# Patient Record
Sex: Male | Born: 1950 | Race: White | Hispanic: No | Marital: Married | Smoking: Never smoker
Health system: Southern US, Community
[De-identification: ages and names within clinical notes are randomized; demographics above are authoritative.]

## PROBLEM LIST (undated history)

## (undated) ENCOUNTER — Emergency Department: Admission: EM | Payer: Medicare PPO | Source: Home / Self Care

## (undated) DIAGNOSIS — I10 Essential (primary) hypertension: Secondary | ICD-10-CM

---

## 2017-07-11 ENCOUNTER — Encounter (INDEPENDENT_AMBULATORY_CARE_PROVIDER_SITE_OTHER): Payer: Self-pay | Admitting: *Deleted

## 2018-10-02 ENCOUNTER — Encounter (INDEPENDENT_AMBULATORY_CARE_PROVIDER_SITE_OTHER): Payer: Self-pay | Admitting: *Deleted

## 2019-03-19 ENCOUNTER — Encounter: Payer: Self-pay | Admitting: Internal Medicine

## 2019-04-22 ENCOUNTER — Ambulatory Visit: Payer: Medicare Other

## 2019-06-30 ENCOUNTER — Other Ambulatory Visit: Payer: Self-pay

## 2019-06-30 DIAGNOSIS — Z20822 Contact with and (suspected) exposure to covid-19: Secondary | ICD-10-CM | POA: Diagnosis present

## 2019-06-30 DIAGNOSIS — K219 Gastro-esophageal reflux disease without esophagitis: Secondary | ICD-10-CM | POA: Diagnosis present

## 2019-06-30 DIAGNOSIS — R61 Generalized hyperhidrosis: Secondary | ICD-10-CM | POA: Diagnosis not present

## 2019-06-30 DIAGNOSIS — R110 Nausea: Secondary | ICD-10-CM | POA: Diagnosis not present

## 2019-06-30 DIAGNOSIS — E876 Hypokalemia: Secondary | ICD-10-CM | POA: Diagnosis present

## 2019-06-30 DIAGNOSIS — K56600 Partial intestinal obstruction, unspecified as to cause: Principal | ICD-10-CM | POA: Diagnosis present

## 2019-06-30 DIAGNOSIS — E871 Hypo-osmolality and hyponatremia: Secondary | ICD-10-CM | POA: Diagnosis present

## 2019-06-30 DIAGNOSIS — R0602 Shortness of breath: Secondary | ICD-10-CM | POA: Diagnosis not present

## 2019-06-30 DIAGNOSIS — R55 Syncope and collapse: Secondary | ICD-10-CM | POA: Diagnosis not present

## 2019-06-30 DIAGNOSIS — I10 Essential (primary) hypertension: Secondary | ICD-10-CM | POA: Diagnosis present

## 2019-06-30 DIAGNOSIS — E869 Volume depletion, unspecified: Secondary | ICD-10-CM | POA: Diagnosis present

## 2019-07-01 ENCOUNTER — Encounter (HOSPITAL_COMMUNITY): Payer: Self-pay | Admitting: Emergency Medicine

## 2019-07-01 ENCOUNTER — Inpatient Hospital Stay (HOSPITAL_COMMUNITY): Payer: Medicare PPO

## 2019-07-01 ENCOUNTER — Emergency Department (HOSPITAL_COMMUNITY): Payer: Medicare PPO

## 2019-07-01 ENCOUNTER — Other Ambulatory Visit: Payer: Self-pay

## 2019-07-01 ENCOUNTER — Inpatient Hospital Stay (HOSPITAL_COMMUNITY)
Admission: EM | Admit: 2019-07-01 | Discharge: 2019-07-02 | DRG: 389 | Disposition: A | Discharge status: Home / Self Care | Payer: Medicare PPO | Attending: Internal Medicine | Admitting: Internal Medicine

## 2019-07-01 DIAGNOSIS — K567 Ileus, unspecified: Secondary | ICD-10-CM | POA: Diagnosis not present

## 2019-07-01 DIAGNOSIS — I10 Essential (primary) hypertension: Secondary | ICD-10-CM | POA: Diagnosis present

## 2019-07-01 DIAGNOSIS — R55 Syncope and collapse: Secondary | ICD-10-CM | POA: Diagnosis not present

## 2019-07-01 DIAGNOSIS — K56600 Partial intestinal obstruction, unspecified as to cause: Secondary | ICD-10-CM | POA: Diagnosis present

## 2019-07-01 DIAGNOSIS — Z0189 Encounter for other specified special examinations: Secondary | ICD-10-CM

## 2019-07-01 DIAGNOSIS — R0600 Dyspnea, unspecified: Secondary | ICD-10-CM

## 2019-07-01 DIAGNOSIS — E876 Hypokalemia: Secondary | ICD-10-CM

## 2019-07-01 DIAGNOSIS — R110 Nausea: Secondary | ICD-10-CM | POA: Diagnosis not present

## 2019-07-01 DIAGNOSIS — R0602 Shortness of breath: Secondary | ICD-10-CM | POA: Diagnosis not present

## 2019-07-01 DIAGNOSIS — E869 Volume depletion, unspecified: Secondary | ICD-10-CM | POA: Diagnosis present

## 2019-07-01 DIAGNOSIS — R61 Generalized hyperhidrosis: Secondary | ICD-10-CM | POA: Diagnosis not present

## 2019-07-01 DIAGNOSIS — E871 Hypo-osmolality and hyponatremia: Secondary | ICD-10-CM

## 2019-07-01 DIAGNOSIS — Z20822 Contact with and (suspected) exposure to covid-19: Secondary | ICD-10-CM | POA: Diagnosis present

## 2019-07-01 DIAGNOSIS — K56609 Unspecified intestinal obstruction, unspecified as to partial versus complete obstruction: Secondary | ICD-10-CM

## 2019-07-01 DIAGNOSIS — K219 Gastro-esophageal reflux disease without esophagitis: Secondary | ICD-10-CM | POA: Diagnosis present

## 2019-07-01 HISTORY — DX: Essential (primary) hypertension: I10

## 2019-07-01 LAB — COMPREHENSIVE METABOLIC PANEL
ALT: 39 U/L (ref 0–44)
AST: 25 U/L (ref 15–41)
Albumin: 4.3 g/dL (ref 3.5–5.0)
Alkaline Phosphatase: 38 U/L (ref 38–126)
Anion gap: 10 (ref 5–15)
BUN: 37 mg/dL — ABNORMAL HIGH (ref 8–23)
CO2: 24 mmol/L (ref 22–32)
Calcium: 8 mg/dL — ABNORMAL LOW (ref 8.9–10.3)
Chloride: 94 mmol/L — ABNORMAL LOW (ref 98–111)
Creatinine, Ser: 1.48 mg/dL — ABNORMAL HIGH (ref 0.61–1.24)
GFR calc Af Amer: 56 mL/min — ABNORMAL LOW (ref >60)
GFR calc non Af Amer: 48 mL/min — ABNORMAL LOW (ref >60)
Glucose, Bld: 117 mg/dL — ABNORMAL HIGH (ref 70–99)
Potassium: 3.3 mmol/L — ABNORMAL LOW (ref 3.5–5.1)
Sodium: 128 mmol/L — ABNORMAL LOW (ref 135–145)
Total Bilirubin: 1 mg/dL (ref 0.3–1.2)
Total Protein: 7.8 g/dL (ref 6.5–8.1)

## 2019-07-01 LAB — CBC
HCT: 45.6 % (ref 39.0–52.0)
HCT: 46.1 % (ref 39.0–52.0)
Hemoglobin: 15.4 g/dL (ref 13.0–17.0)
Hemoglobin: 15.6 g/dL (ref 13.0–17.0)
MCH: 29.6 pg (ref 26.0–34.0)
MCH: 29.5 pg (ref 26.0–34.0)
MCHC: 33.8 g/dL (ref 30.0–36.0)
MCHC: 33.8 g/dL (ref 30.0–36.0)
MCV: 87.5 fL (ref 80.0–100.0)
MCV: 87.1 fL (ref 80.0–100.0)
Platelets: 264 10*3/uL (ref 150–400)
Platelets: 290 10*3/uL (ref 150–400)
RBC: 5.21 MIL/uL (ref 4.22–5.81)
RBC: 5.29 MIL/uL (ref 4.22–5.81)
RDW: 14.3 % (ref 11.5–15.5)
RDW: 14.3 % (ref 11.5–15.5)
WBC: 8.4 10*3/uL (ref 4.0–10.5)
WBC: 6.5 10*3/uL (ref 4.0–10.5)
nRBC: 0 % (ref 0.0–0.2)
nRBC: 0 % (ref 0.0–0.2)

## 2019-07-01 LAB — BASIC METABOLIC PANEL
Anion gap: 12 (ref 5–15)
BUN: 32 mg/dL — ABNORMAL HIGH (ref 8–23)
CO2: 25 mmol/L (ref 22–32)
Calcium: 8.3 mg/dL — ABNORMAL LOW (ref 8.9–10.3)
Chloride: 96 mmol/L — ABNORMAL LOW (ref 98–111)
Creatinine, Ser: 1.2 mg/dL (ref 0.61–1.24)
GFR calc Af Amer: >60 mL/min (ref >60)
GFR calc non Af Amer: >60 mL/min (ref >60)
Glucose, Bld: 119 mg/dL — ABNORMAL HIGH (ref 70–99)
Potassium: 3.8 mmol/L (ref 3.5–5.1)
Sodium: 133 mmol/L — ABNORMAL LOW (ref 135–145)

## 2019-07-01 LAB — URINALYSIS, ROUTINE W REFLEX MICROSCOPIC
Bacteria, UA: NONE SEEN
Bilirubin Urine: NEGATIVE
Glucose, UA: NEGATIVE mg/dL
Ketones, ur: 5 mg/dL — AB
Leukocytes,Ua: NEGATIVE
Nitrite: NEGATIVE
Protein, ur: NEGATIVE mg/dL
Specific Gravity, Urine: 1.024 (ref 1.005–1.030)
pH: 5 (ref 5.0–8.0)

## 2019-07-01 LAB — TROPONIN I (HIGH SENSITIVITY)
Troponin I (High Sensitivity): 5 ng/L (ref <18)
Troponin I (High Sensitivity): 5 ng/L (ref <18)

## 2019-07-01 LAB — SARS CORONAVIRUS 2 (TAT 6-24 HRS): SARS Coronavirus 2: NEGATIVE

## 2019-07-01 LAB — D-DIMER, QUANTITATIVE: D-Dimer, Quant: 1.62 ug/mL-FEU — ABNORMAL HIGH (ref 0.00–0.50)

## 2019-07-01 LAB — LIPASE, BLOOD: Lipase: 21 U/L (ref 11–51)

## 2019-07-01 LAB — GLUCOSE, CAPILLARY: Glucose-Capillary: 104 mg/dL — ABNORMAL HIGH (ref 70–99)

## 2019-07-01 LAB — HIV ANTIBODY (ROUTINE TESTING W REFLEX): HIV Screen 4th Generation wRfx: NONREACTIVE

## 2019-07-01 LAB — LACTIC ACID, PLASMA: Lactic Acid, Venous: 0.9 mmol/L (ref 0.5–1.9)

## 2019-07-01 MED ORDER — ONDANSETRON HCL 4 MG PO TABS: 4.0000 mg | ORAL_TABLET | Freq: Four times a day (QID) | ORAL | Status: DC | PRN

## 2019-07-01 MED ORDER — ACETAMINOPHEN 650 MG RE SUPP: 650.0000 mg | Freq: Four times a day (QID) | RECTAL | Status: DC | PRN

## 2019-07-01 MED ORDER — DIATRIZOATE MEGLUMINE & SODIUM 66-10 % PO SOLN
ORAL | Status: AC
  Filled 2019-07-01: qty 90

## 2019-07-01 MED ORDER — DIATRIZOATE MEGLUMINE & SODIUM 66-10 % PO SOLN
90.0000 mL | Freq: Once | ORAL | Status: DC
  Filled 2019-07-01: qty 90

## 2019-07-01 MED ORDER — FENTANYL CITRATE (PF) 100 MCG/2ML IJ SOLN
50.0000 ug | Freq: Once | INTRAMUSCULAR | Status: AC
  Administered 2019-07-01: 01:00:00 50 ug via INTRAVENOUS
  Filled 2019-07-01: qty 2

## 2019-07-01 MED ORDER — IOHEXOL 300 MG/ML  SOLN
100.0000 mL | Freq: Once | INTRAMUSCULAR | Status: AC | PRN
  Administered 2019-07-01: 100 mL via INTRAVENOUS

## 2019-07-01 MED ORDER — SODIUM CHLORIDE 0.9% FLUSH: 3.0000 mL | Freq: Once | INTRAVENOUS | Status: DC

## 2019-07-01 MED ORDER — POTASSIUM CHLORIDE IN NACL 20-0.9 MEQ/L-% IV SOLN: INTRAVENOUS | Status: DC

## 2019-07-01 MED ORDER — FENTANYL CITRATE (PF) 100 MCG/2ML IJ SOLN
50.0000 ug | Freq: Once | INTRAMUSCULAR | Status: AC
  Administered 2019-07-01: 08:00:00 50 ug via INTRAVENOUS
  Filled 2019-07-01: qty 2

## 2019-07-01 MED ORDER — DIATRIZOATE MEGLUMINE & SODIUM 66-10 % PO SOLN
90.0000 mL | Freq: Once | ORAL | Status: AC
  Administered 2019-07-01: 13:00:00 90 mL via NASOGASTRIC
  Filled 2019-07-01: qty 90

## 2019-07-01 MED ORDER — POTASSIUM CHLORIDE 10 MEQ/100ML IV SOLN
10.0000 meq | INTRAVENOUS | Status: AC
  Administered 2019-07-01 (×2): 10 meq via INTRAVENOUS
  Filled 2019-07-01: qty 100

## 2019-07-01 MED ORDER — FENTANYL CITRATE (PF) 100 MCG/2ML IJ SOLN
50.0000 ug | INTRAMUSCULAR | Status: DC | PRN
  Administered 2019-07-01: 13:00:00 50 ug via INTRAVENOUS
  Filled 2019-07-01: qty 2

## 2019-07-01 MED ORDER — PANTOPRAZOLE SODIUM 40 MG IV SOLR
40.0000 mg | INTRAVENOUS | Status: DC
  Administered 2019-07-01 – 2019-07-02 (×2): 40 mg via INTRAVENOUS
  Filled 2019-07-01 (×2): qty 40

## 2019-07-01 MED ORDER — ACETAMINOPHEN 325 MG PO TABS: 650.0000 mg | ORAL_TABLET | Freq: Four times a day (QID) | ORAL | Status: DC | PRN

## 2019-07-01 MED ORDER — ENOXAPARIN SODIUM 40 MG/0.4ML ~~LOC~~ SOLN
40.0000 mg | SUBCUTANEOUS | Status: DC
  Administered 2019-07-01: 40 mg via SUBCUTANEOUS
  Filled 2019-07-01: qty 0.4

## 2019-07-01 MED ORDER — ONDANSETRON HCL 4 MG/2ML IJ SOLN: 4.0000 mg | Freq: Four times a day (QID) | INTRAMUSCULAR | Status: DC | PRN

## 2019-07-01 MED ORDER — HYDRALAZINE HCL 20 MG/ML IJ SOLN: 10.0000 mg | Freq: Four times a day (QID) | INTRAMUSCULAR | Status: DC | PRN

## 2019-07-01 MED ORDER — SODIUM CHLORIDE 0.9 % IV BOLUS
1000.0000 mL | Freq: Once | INTRAVENOUS | Status: AC
  Administered 2019-07-01: 01:00:00 1000 mL via INTRAVENOUS

## 2019-07-01 MED ORDER — ONDANSETRON HCL 4 MG/2ML IJ SOLN
4.0000 mg | Freq: Once | INTRAMUSCULAR | Status: AC
  Administered 2019-07-01: 4 mg via INTRAVENOUS
  Filled 2019-07-01: qty 2

## 2019-07-01 NOTE — H&P (Signed)
History and Physical  George Horton [id number] DOB: [birth year] DOA: 07/01/2019   PCP: Beola Cord, FNP   Patient coming from: Home  Chief Complaint: Abdominal pain and vomiting  HPI:  George Horton is a 69 y.o. male with medical history of hypertension, GERD presenting with 2-day history of abdominal pain with nausea and vomiting.  The patient states that he began having abdominal pain on 06/28/2019 after eating some microwave lasagna.  He subsequently had an episode of nausea and vomiting.  In the early morning on 06/29/2019, the the patient had another episode of nausea and vomiting.  He stated that he had some temporary relief of his abdominal pain and vomiting that day.  However, on 06/30/2019, the patient ate some potato soup and began having worsening abdominal pain again with another episode of emesis.  He tried taking some over-the-counter laxatives and suppositories without any success.  His last bowel movement was on 06/28/2019.  He denies any fevers, chills, chest pain, shortness breath, cough.hemoptysis, diarrhea, hematochezia, melena, headache, neck pain.  Patient takes 2 Aleve daily for arthritis pain for the last 5 years. In the emergency department, patient was afebrile hemodynamically stable with oxygen saturation 95% room air.  Sodium 128, potassium 3.3, serum creatinine 1.48.  LFTs were unremarkable.  WBC 8.4, hemoglobin 15.4, platelets 264,000.  CT abdomen/pelvis showed multiple dilated loops of small bowel throughout the abdomen consistent with a partial small bowel obstruction.  UA was negative for pyuria.  EKG shows sinus rhythm with nonspecific ST-T wave changes.  Assessment/Plan: Small bowel obstruction -General surgery consult -Remain n.p.o. -IV fluids -Judicious IV opioids -Increase activity  Essential hypertension -Holding lisinopril until able to take p.o. -hold HCTZ due to volume depletion -Hydralazine prn SBP >180  Hyponatremia -Secondary to volume  depletion -Start IV fluids  Hypokalemia -Replete -Check magnesium  GERD -Continue Protonix  Active Problems:   Partial small bowel obstruction (HCC)       Past Medical History:  Diagnosis Date  . Hypertension    History reviewed. No pertinent surgical history. Social History:  reports that he has never smoked. He has never used smokeless tobacco. He reports that he does not drink alcohol or use drugs.   Family Hx: reviewed, no pertinent family hx  Prior to Admission medications   Not on File    Review of Systems:  Constitutional:  No weight loss, night sweats, Fevers, chills, fatigue.  Head&Eyes: No headache.  No vision loss.  No eye pain or scotoma ENT:  No Difficulty swallowing,Tooth/dental problems,Sore throat,  No ear ache, post nasal drip,  Cardio-vascular:  No chest pain, Orthopnea, PND, swelling in lower extremities,  dizziness, palpitations  GI:  No  diarrhea, loss of appetite, hematochezia, melena, heartburn, indigestion, Resp:  No shortness of breath with exertion or at rest. No cough. No coughing up of blood .No wheezing.No chest wall deformity  Skin:  no rash or lesions.  GU:  no dysuria, change in color of urine, no urgency or frequency. No flank pain.  Musculoskeletal:  No joint pain or swelling. No decreased range of motion. No back pain.  Psych:  No change in mood or affect. No depression or anxiety. Neurologic: No headache, no dysesthesia, no focal weakness, no vision loss. No syncope  Physical Exam: Vitals:   07/01/19 0130 07/01/19 0700 07/01/19 0800 07/01/19 0830  BP: 110/78 127/75 138/89 (!) 131/106  Pulse: (!) 101 100 100 100  Resp: 18 15 17  (!) 22  Temp:  TempSrc:      SpO2: 92% 100% 100% 100%  Weight:      Height:       General:  A&O x 3, NAD, nontoxic, pleasant/cooperative Head/Eye: No conjunctival hemorrhage, no icterus, [place]/AT, No nystagmus ENT:  No icterus,  No thrush, good dentition, no pharyngeal exudate Neck:  No  masses, no lymphadenpathy, no bruits CV:  RRR, no rub, no gallop, no S3 Lung:  CTAB, good air movement, no wheeze, no rhonchi Abdomen: soft/peribumbilical, +BS, nondistended, no peritoneal signs Ext: No cyanosis, No rashes, No petechiae, No lymphangitis, No edema Neuro: CNII-XII intact, strength 4/5 in bilateral upper and lower extremities, no dysmetria  Labs on Admission:  Basic Metabolic Panel: Recent Labs  Lab 07/01/19 0111  NA 128*  K 3.3*  CL 94*  CO2 24  GLUCOSE 117*  BUN 37*  CREATININE 1.48*  CALCIUM 8.0*   Liver Function Tests: Recent Labs  Lab 07/01/19 0111  AST 25  ALT 39  ALKPHOS 38  BILITOT 1.0  PROT 7.8  ALBUMIN 4.3   Recent Labs  Lab 07/01/19 0111  LIPASE 21   No results for input(s): AMMONIA in the last 168 hours. CBC: Recent Labs  Lab 07/01/19 0111  WBC 8.4  HGB 15.4  HCT 45.6  MCV 87.5  PLT 264   Coagulation Profile: No results for input(s): INR, PROTIME in the last 168 hours. Cardiac Enzymes: No results for input(s): CKTOTAL, CKMB, CKMBINDEX, TROPONINI in the last 168 hours. BNP: Invalid input(s): POCBNP CBG: No results for input(s): GLUCAP in the last 168 hours. Urine analysis:    Component Value Date/Time   COLORURINE YELLOW 07/01/2019 0025   APPEARANCEUR CLEAR 07/01/2019 0025   LABSPEC 1.024 07/01/2019 0025   PHURINE 5.0 07/01/2019 0025   GLUCOSEU NEGATIVE 07/01/2019 0025   HGBUR SMALL (A) 07/01/2019 0025   BILIRUBINUR NEGATIVE 07/01/2019 0025   KETONESUR 5 (A) 07/01/2019 0025   PROTEINUR NEGATIVE 07/01/2019 0025   NITRITE NEGATIVE 07/01/2019 0025   LEUKOCYTESUR NEGATIVE 07/01/2019 0025   Sepsis Labs: @LABRCNTIP (procalcitonin:4,lacticidven:4) )No results found for this or any previous visit (from the past 240 hour(s)).   Radiological Exams on Admission: CT ABDOMEN PELVIS W CONTRAST  Result Date: 07/01/2019 CLINICAL DATA:  Right upper quadrant pain. EXAM: CT ABDOMEN AND PELVIS WITH CONTRAST TECHNIQUE: Multidetector CT  imaging of the abdomen and pelvis was performed using the standard protocol following bolus administration of intravenous contrast. CONTRAST:  151mL OMNIPAQUE IOHEXOL 300 MG/ML  SOLN COMPARISON:  None. FINDINGS: Lower chest: Mild linear scarring and/or atelectasis is seen along the anteromedial aspect of the right middle lobe. Hepatobiliary: No focal liver abnormality is seen. No gallstones, gallbladder wall thickening, or biliary dilatation. Pancreas: Unremarkable. No pancreatic ductal dilatation or surrounding inflammatory changes. Spleen: Normal in size without focal abnormality. Adrenals/Urinary Tract: Adrenal glands are unremarkable. Kidneys are normal, without renal calculi, focal lesion, or hydronephrosis. Bladder is unremarkable. Stomach/Bowel: Stomach is within normal limits. Appendix appears normal. Multiple dilated loops of small bowel are seen throughout the abdomen and pelvis. A clear transition zone is not identified. Vascular/Lymphatic: Mild to moderate severity aortic calcification. No enlarged abdominal or pelvic lymph nodes. Reproductive: The prostate gland is mildly enlarged. Other: There is a 4.9 cm x 3.9 cm fat containing right inguinal hernia. An additional 4.4 cm x 2.8 cm fat containing left inguinal hernia is seen. Musculoskeletal: Multilevel degenerative changes seen throughout the lumbar spine. IMPRESSION: 1. Multiple dilated loops of small bowel throughout the abdomen and pelvis, consistent with  a partial small bowel obstruction. 2. Bilateral fat containing inguinal hernias. 3. Mild to moderate severity aortic calcification. 4. Mildly enlarged prostate gland. Aortic Atherosclerosis (ICD10-I70.0). Electronically Signed   By: Virgina Norfolk M.D.   On: 07/01/2019 03:09    EKG: Independently reviewed. Sinus, nonspecific STT changes III, aVF    Time spent:60 minutes Code Status:   FULL Family Communication:  No Family at bedside Disposition Plan: expect 2-3 day  hospitalization Consults called: general surgery DVT Prophylaxis: [place] Lovenox  Orson Eva, DO  [facility] Pager [phone]  If 7PM-7AM, please contact night-coverage [email or web address] Password [facility] 07/01/2019, 8:44 AM

## 2019-07-01 NOTE — ED Notes (Signed)
Dr. Carles Collet in room assessing patient.

## 2019-07-01 NOTE — Progress Notes (Signed)
[facility]  Saw note for Rapid response. Patient with vagal reaction. If gets very nauseated or vomits would get NG placed. No vomiting since gastrografin taken.  Dilated loops on Xray otherwise.   Discussed with Dr. Carles Collet.  Curlene Labrum, MD [facility] [address] [place], [place] [address] [phone] (office)

## 2019-07-01 NOTE — Progress Notes (Signed)
Responded to nursing call:  Rapid response approx 5-10 min after drinking gastrograffin, pt developed nausea, diaphoresis, dizziness, near syncope.  Denies f/c, cp, cough. Denies focal extremity weakness, visual disturbance.  Complains of left bicep pain.   98.4--HR--83--107/90-->120.80 97%RA   Vitals:   07/01/19 0800 07/01/19 0830 07/01/19 0916 07/01/19 1307  BP: 138/89 (!) 131/106 126/80 107/90  Pulse: 100 100 96 87  Resp: 17 (!) 22 20 20   Temp:   98 F (36.7 C) 98.4 F (36.9 C)  TempSrc:   Oral Oral  SpO2: 100% 100% 97% 96%  Weight:      Height:       CV--RRR Lung--CTA Abd--soft+BS/peribumbilical tender, ND  Personally reviewed EKC--sinus, nonspecific TWI inferior leads CXR-   Assessment/Plan:  Vagal reaction -CBG 104 -cycle troponins -D-dimer -BMP CBC -place on tele    Orson Eva, DO [facility]

## 2019-07-01 NOTE — ED Notes (Signed)
Have paged Dr. Joesph Fillers about patient.

## 2019-07-01 NOTE — Progress Notes (Signed)
Patient received Gastrogafin 90 mL at 1239. At 1306 patient called to say he was not feeling well. Patient was observed to be very diaphoretic and pale with decreased level of consciousness. Rapid response was called and MD paged. MD is currently in room assessing patient. Respiratory therapist and rapid response nurse also in room. EKG stat obtained and vital signs are as follows     07/01/19 1307  Vitals  Temp 98.4 F (36.9 C)  Temp Source Oral  BP 107/90  BP Location Right Arm  BP Method Automatic  Patient Position (if appropriate) Lying  Pulse Rate 87  Pulse Rate Source Dinamap  Resp 20  Oxygen Therapy  SpO2 96 %  O2 Device Room Air  MEWS Score  MEWS Temp 0  MEWS Systolic 0  MEWS Pulse 0  MEWS RR 0  MEWS LOC 0  MEWS Score 0  MEWS Score Color Green   CBG 104. Chest XRay stat also obtained and orders for labs including troponin at this time. Patient is now observed to have increased level of consciousness. Will continue to monitor patient.

## 2019-07-01 NOTE — Care Management Important Message (Signed)
Important Message  Patient Details  Name: George Horton MRN: [id number] Date of Birth: [birth year]   Medicare Important Message Given:  Yes     Tommy Medal 07/01/2019, 4:02 PM

## 2019-07-01 NOTE — Progress Notes (Signed)
   07/01/19 1307  Assess: MEWS Score  Temp 98.4 F (36.9 C)  BP 107/90  Pulse Rate 87  Resp 20  SpO2 96 %  O2 Device Room Air  Assess: MEWS Score  MEWS Temp 0  MEWS Systolic 0  MEWS Pulse 0  MEWS RR 0  MEWS LOC 0  MEWS Score 0  MEWS Score Color Green  Treat  MEWS Interventions Escalated (See documentation below);Consulted Respiratory Therapy  Take Vital Signs  Increase Vital Sign Frequency   (Q 2 hr x 2)  Escalate  MEWS: Escalate  (Mews score green however q 2 hr vitals x 2)  Notify: Charge Nurse/RN  Name of Charge Nurse/RN Notified L Jeffree Cazeau   Date Charge Nurse/RN Notified 07/01/19  Time Charge Nurse/RN Notified 1306  Notify: Provider  Provider Name/Title Dr. Carles Collet  Date Provider Notified 07/01/19  Time Provider Notified 1307  Notification Type Page  Notification Reason Change in status  Response See new orders (EKG 12 lead stat, chest XRay stat, Troponins and labs)  Date of Provider Response 07/01/19  Time of Provider Response [id number]  Notify: Rapid Response  Name of Rapid Response RN Notified Tiffany  Date Rapid Response Notified 07/01/19  Time Rapid Response Notified [id number]  Document  Patient Outcome Stabilized after interventions

## 2019-07-01 NOTE — Progress Notes (Signed)
[facility]  Dilated bowel and contrast not to colon but clinically improved and BMs.  Will repeat X-ray in am. Continue clears for now.  Curlene Labrum, MD

## 2019-07-01 NOTE — ED Triage Notes (Signed)
Pt presents with complaints of abdominal pain, cramping, and bloating. Pt states this began on Friday after eating a salad and throwing up.

## 2019-07-01 NOTE — Consult Note (Signed)
[facility] Consult  Reason for Consult: pSBO versus ileus  Referring Physician:  Dr. Carles Collet   Chief Complaint    Abdominal Pain      HPI: George Horton is a 69 y.o. male with complaints of acute onset of RLQ pain and nausea with some vomiting. He came to the hospital last night and reports not having a BM since Friday. He says he normally goes every few days and takes metamucil to have a BM. He says that he actually took a laxative this weekend, but still could not have a BM. He says he is having flatus and last vomited last night.  He says that his pain has been better since getting pain medication in the ED. He was evaluated with a Ct and labs. He as found to have hyponatremia and hypokalemia thought to be secondary to his vomiting.  He denies ever having any abdominal surgery or bad abdominal infections. He has had a colonoscopy at age 36 and this was negative. He reports having a virtual visit to discuss a repeat in the upcoming weeks.   He otherwise is healthy and has no cardiac issues or complaints. He denies any chest pain or SOB.   Past Medical History:  Diagnosis Date  . Hypertension     History reviewed. No pertinent surgical history.  Family History  Problem Relation Age of Onset  . Atrial fibrillation Mother   . Heart failure Father   . Colon cancer Neg Hx     Social History   Tobacco Use  . Smoking status: Never Smoker  . Smokeless tobacco: Never Used  Substance Use Topics  . Alcohol use: Never  . Drug use: Never    Medications:  I have reviewed the patient's current medications. Prior to Admission:  Medications Prior to Admission  Medication Sig Dispense Refill Last Dose  . hydrochlorothiazide (HYDRODIURIL) 12.5 MG tablet Take 12.5 mg by mouth every morning.   06/30/2019 at 0730  . lisinopril (ZESTRIL) 20 MG tablet Take 20 mg by mouth daily.   06/30/2019 at 0730  . naproxen sodium (ALEVE) 220 MG tablet Take 440 mg by mouth daily. Arthritis pain.    06/30/2019 at 0730  . omeprazole (PRILOSEC) 40 MG capsule Take 40 mg by mouth every morning.   06/30/2019 at 0730   Scheduled: . diatrizoate meglumine-sodium      . diatrizoate meglumine-sodium  90 mL Oral Once  . enoxaparin (LOVENOX) injection  40 mg Subcutaneous Q24H  . pantoprazole (PROTONIX) IV  40 mg Intravenous Q24H  . sodium chloride flush  3 mL Intravenous Once   Continuous: . 0.9 % NaCl with KCl 20 mEq / L 100 mL/hr at 07/01/19 1235   [id number] **OR** acetaminophen, fentaNYL (SUBLIMAZE) injection, hydrALAZINE, ondansetron **OR** ondansetron (ZOFRAN) IV  No Known Allergies   ROS:  A comprehensive review of systems was negative except for: Gastrointestinal: positive for abdominal pain, nausea and vomiting  Blood pressure 107/90, pulse 87, temperature 98.4 F (36.9 C), temperature source Oral, resp. rate 20, height 5\' 9"  (1.753 m), weight 108.9 kg, SpO2 96 %. Physical Exam Vitals reviewed.  Constitutional:      Appearance: He is obese.  HENT:     Head: Normocephalic and atraumatic.  Eyes:     Extraocular Movements: Extraocular movements intact.  Cardiovascular:     Rate and Rhythm: Normal rate.  Pulmonary:     Effort: Pulmonary effort is normal.  Abdominal:     General: There is distension.  Palpations: Abdomen is soft.     Tenderness: There is no guarding or rebound.  Musculoskeletal:     Comments: Moves all extremities, no edema   Skin:    General: Skin is warm and dry.  Neurological:     General: No focal deficit present.     Mental Status: He is alert and oriented to person, place, and time.  Psychiatric:        Mood and Affect: Mood normal.        Behavior: Behavior normal.     Results: Results for orders placed or performed during the hospital encounter of 07/01/19 (from the past 48 hour(s))  Urinalysis, Routine w reflex microscopic     Status: Abnormal   Collection Time: 07/01/19 12:25 AM  Result Value Ref Range   Color, Urine YELLOW  YELLOW   APPearance CLEAR CLEAR   Specific Gravity, Urine 1.024 1.005 - 1.030   pH 5.0 5.0 - 8.0   Glucose, UA NEGATIVE NEGATIVE mg/dL   Hgb urine dipstick SMALL (A) NEGATIVE   Bilirubin Urine NEGATIVE NEGATIVE   Ketones, ur 5 (A) NEGATIVE mg/dL   Protein, ur NEGATIVE NEGATIVE mg/dL   Nitrite NEGATIVE NEGATIVE   Leukocytes,Ua NEGATIVE NEGATIVE   RBC / HPF 0-5 0 - 5 RBC/hpf   WBC, UA 0-5 0 - 5 WBC/hpf   Bacteria, UA NONE SEEN NONE SEEN   Mucus PRESENT     Comment: Performed at [facility], [address], [place], [place] [address]  Lipase, blood     Status: None   Collection Time: 07/01/19  1:11 AM  Result Value Ref Range   Lipase 21 11 - 51 U/L    Comment: Performed at [facility], [address], [place], [place] [address]  Comprehensive metabolic panel     Status: Abnormal   Collection Time: 07/01/19  1:11 AM  Result Value Ref Range   Sodium 128 (L) 135 - 145 mmol/L   Potassium 3.3 (L) 3.5 - 5.1 mmol/L   Chloride 94 (L) 98 - 111 mmol/L   CO2 24 22 - 32 mmol/L   Glucose, Bld 117 (H) 70 - 99 mg/dL    Comment: Glucose reference range applies only to samples taken after fasting for at least 8 hours.   BUN 37 (H) 8 - 23 mg/dL   Creatinine, Ser 1.48 (H) 0.61 - 1.24 mg/dL   Calcium 8.0 (L) 8.9 - 10.3 mg/dL   Total Protein 7.8 6.5 - 8.1 g/dL   Albumin 4.3 3.5 - 5.0 g/dL   AST 25 15 - 41 U/L   ALT 39 0 - 44 U/L   Alkaline Phosphatase 38 38 - 126 U/L   Total Bilirubin 1.0 0.3 - 1.2 mg/dL   GFR calc non Af Amer 48 (L) >60 mL/min   GFR calc Af Amer 56 (L) >60 mL/min   Anion gap 10 5 - 15    Comment: Performed at [facility], [address], [place], [place] [address]  CBC     Status: None   Collection Time: 07/01/19  1:11 AM  Result Value Ref Range   WBC 8.4 4.0 - 10.5 K/uL   RBC 5.21 4.22 - 5.81 MIL/uL   Hemoglobin 15.4 13.0 - 17.0 g/dL   HCT 45.6 39.0 - 52.0 %   MCV 87.5 80.0 - 100.0 fL   MCH 29.6 26.0 - 34.0 pg   MCHC 33.8 30.0 - 36.0 g/dL   RDW 14.3 11.5 - 15.5 %  Platelets 264 150 - 400 K/uL   nRBC 0.0 0.0 - 0.2 %    Comment: Performed at [facility], [address], [place], [place] [address]  Lactic acid, plasma     Status: None   Collection Time: 07/01/19  1:11 AM  Result Value Ref Range   Lactic Acid, Venous 0.9 0.5 - 1.9 mmol/L    Comment: Performed at [facility], [address] [place], [place] [address]  Glucose, capillary     Status: Abnormal   Collection Time: 07/01/19  1:08 PM  Result Value Ref Range   Glucose-Capillary 104 (H) 70 - 99 mg/dL    Comment: Glucose reference range applies only to samples taken after fasting for at least 8 hours.   Reviewed CT personally- dilated small intestine transitioning to decompressed bowel with no obvious transition, tapers in the RLQ CT ABDOMEN PELVIS W CONTRAST  Result Date: 07/01/2019 CLINICAL DATA:  Right upper quadrant pain. EXAM: CT ABDOMEN AND PELVIS WITH CONTRAST TECHNIQUE: Multidetector CT imaging of the abdomen and pelvis was performed using the standard protocol following bolus administration of intravenous contrast. CONTRAST:  1107mL OMNIPAQUE IOHEXOL 300 MG/ML  SOLN COMPARISON:  None. FINDINGS: Lower chest: Mild linear scarring and/or atelectasis is seen along the anteromedial aspect of the right middle lobe. Hepatobiliary: No focal liver abnormality is seen. No gallstones, gallbladder wall thickening, or biliary dilatation. Pancreas: Unremarkable. No pancreatic ductal dilatation or surrounding inflammatory changes. Spleen: Normal in size without focal abnormality. Adrenals/Urinary Tract: Adrenal glands are unremarkable. Kidneys are normal, without renal calculi, focal lesion, or hydronephrosis. Bladder is unremarkable. Stomach/Bowel: Stomach is within normal limits. Appendix appears normal. Multiple dilated loops of small bowel are seen throughout the abdomen and pelvis. A clear transition zone is not identified. Vascular/Lymphatic: Mild to moderate severity aortic calcification. No enlarged  abdominal or pelvic lymph nodes. Reproductive: The prostate gland is mildly enlarged. Other: There is a 4.9 cm x 3.9 cm fat containing right inguinal hernia. An additional 4.4 cm x 2.8 cm fat containing left inguinal hernia is seen. Musculoskeletal: Multilevel degenerative changes seen throughout the lumbar spine. IMPRESSION: 1. Multiple dilated loops of small bowel throughout the abdomen and pelvis, consistent with a partial small bowel obstruction. 2. Bilateral fat containing inguinal hernias. 3. Mild to moderate severity aortic calcification. 4. Mildly enlarged prostate gland. Aortic Atherosclerosis (ICD10-I70.0). Electronically Signed   By: Virgina Norfolk M.D.   On: 07/01/2019 03:09     Assessment & Plan:  Farron Yeargin is a 69 y.o. male with either an ileus or a pSBO from prior infections or unknown etiology. He denies any current nausea or  Vomiting and has not received an NG. His stomach is not very dilated on the CT. Will try for a SBFT with gastrografin to see if this really is just an ileus.  Potentially GI bug and electrolyte abnormalities contributing to dysmotility. Discussed that it would be unusual to have to do surgery given the CT findings but if an obstruction persisted that we may have to do surgery.   -SBFT with gastrografin oral, as no vomiting or nausea now and passing flatus  -Replace lytes with K to 4, phos to 3, Mg to 2, and Na to normal limits   Discussed with Dr. Carles Collet  All questions were answered to the satisfaction of the patient.    Virl Cagey 07/01/2019, 1:11 PM

## 2019-07-01 NOTE — ED Provider Notes (Signed)
[facility] EMERGENCY DEPARTMENT Provider Note   CSN: [id number] Arrival date & time: 06/30/19  2339     History Chief Complaint  Patient presents with  . Abdominal Pain    George Horton is a 69 y.o. male.  Patient here with upper abdominal pain, nausea, vomiting, cramping and bloating.  States he first got ill 2 days ago after eating a salad.  He developed upper abdominal pain with one episode of vomiting Friday night and one episode of vomiting Saturday morning.  Since then he said diffuse abdominal pain feels bloated and crampy.  He said a few "squirts" of diarrhea but no significant bowel movement.  States he cannot pass gas.  He has no appetite but was able to eat some potato soup today.  Has not been eating or drinking thing for the past 2 days.  No fever.  No chest pain or shortness of breath.  No pain with urination or back pain.  No previous abdominal surgeries.  No recent travel or sick contacts.  Does take omeprazole for history of acid reflux.  The history is provided by the patient.  Abdominal Pain Associated symptoms: diarrhea, nausea and vomiting   Associated symptoms: no dysuria, no fever, no hematuria and no shortness of breath        Past Medical History:  Diagnosis Date  . Hypertension     There are no problems to display for this patient.   History reviewed. No pertinent surgical history.     No family history on file.  Social History   Tobacco Use  . Smoking status: Never Smoker  . Smokeless tobacco: Never Used  Substance Use Topics  . Alcohol use: Never  . Drug use: Never    Home Medications Prior to Admission medications   Not on File    Allergies    Patient has no allergy information on record.  Review of Systems   Review of Systems  Constitutional: Positive for activity change and appetite change. Negative for fever.  HENT: Negative for congestion.   Respiratory: Negative for chest tightness and shortness of breath.    Gastrointestinal: Positive for abdominal pain, diarrhea, nausea and vomiting.  Genitourinary: Negative for dysuria and hematuria.  Musculoskeletal: Negative for arthralgias and myalgias.  Skin: Negative for rash.  Neurological: Negative for dizziness, weakness and headaches.   all other systems are negative except as noted in the HPI and PMH.    Physical Exam Updated Vital Signs BP 138/84 (BP Location: Right Arm)   Pulse (!) 106   Temp 98.2 F (36.8 C) (Oral)   Resp 17   Ht 5\' 9"  (1.753 m)   Wt 108.9 kg   SpO2 95%   BMI 35.44 kg/m   Physical Exam Vitals and nursing note reviewed.  Constitutional:      General: He is in acute distress.     Appearance: He is well-developed.     Comments: Uncomfortable  HENT:     Head: Normocephalic and atraumatic.     Mouth/Throat:     Pharynx: No oropharyngeal exudate.  Eyes:     Conjunctiva/sclera: Conjunctivae normal.     Pupils: Pupils are equal, round, and reactive to light.  Neck:     Comments: No meningismus. Cardiovascular:     Rate and Rhythm: Normal rate and regular rhythm.     Heart sounds: Normal heart sounds. No murmur.  Pulmonary:     Effort: Pulmonary effort is normal. No respiratory distress.     Breath  sounds: Normal breath sounds.  Abdominal:     Palpations: Abdomen is soft.     Tenderness: There is abdominal tenderness. There is guarding. There is no rebound.     Comments: Distended abdomen, diffusely tender, right upper quadrant and epigastric tenderness with voluntary  Musculoskeletal:        General: No tenderness. Normal range of motion.     Cervical back: Normal range of motion and neck supple.  Skin:    General: Skin is warm.     Capillary Refill: Capillary refill takes less than 2 seconds.  Neurological:     General: No focal deficit present.     Mental Status: He is alert and oriented to person, place, and time. Mental status is at baseline.     Cranial Nerves: No cranial nerve deficit.     Motor: No  abnormal muscle tone.     Coordination: Coordination normal.     Comments: No ataxia on finger to nose bilaterally. No pronator drift. 5/5 strength throughout. CN 2-12 intact.Equal grip strength. Sensation intact.   Psychiatric:        Behavior: Behavior normal.     ED Results / Procedures / Treatments   Labs (all labs ordered are listed, but only abnormal results are displayed) Labs Reviewed  COMPREHENSIVE METABOLIC PANEL - Abnormal; Notable for the following components:      Result Value   Sodium 128 (*)    Potassium 3.3 (*)    Chloride 94 (*)    Glucose, Bld 117 (*)    BUN 37 (*)    Creatinine, Ser 1.48 (*)    Calcium 8.0 (*)    GFR calc non Af Amer 48 (*)    GFR calc Af Amer 56 (*)    All other components within normal limits  SARS CORONAVIRUS 2 (TAT 6-24 HRS)  LIPASE, BLOOD  CBC  LACTIC ACID, PLASMA  URINALYSIS, ROUTINE W REFLEX MICROSCOPIC    EKG EKG Interpretation  Date/Time:  Monday July 01 2019 01:55:46 EDT Ventricular Rate:  97 PR Interval:    QRS Duration: 100 QT Interval:  358 QTC Calculation: 455 R Axis:   89 Text Interpretation: Sinus rhythm Borderline right axis deviation Low voltage, precordial leads Nonspecific T abnormalities, inferior leads Nonspecific T wave abnormality Confirmed by Ezequiel Essex [phone]) on 07/01/2019 1:57:52 AM   Radiology CT ABDOMEN PELVIS W CONTRAST  Result Date: 07/01/2019 CLINICAL DATA:  Right upper quadrant pain. EXAM: CT ABDOMEN AND PELVIS WITH CONTRAST TECHNIQUE: Multidetector CT imaging of the abdomen and pelvis was performed using the standard protocol following bolus administration of intravenous contrast. CONTRAST:  129mL OMNIPAQUE IOHEXOL 300 MG/ML  SOLN COMPARISON:  None. FINDINGS: Lower chest: Mild linear scarring and/or atelectasis is seen along the anteromedial aspect of the right middle lobe. Hepatobiliary: No focal liver abnormality is seen. No gallstones, gallbladder wall thickening, or biliary dilatation.  Pancreas: Unremarkable. No pancreatic ductal dilatation or surrounding inflammatory changes. Spleen: Normal in size without focal abnormality. Adrenals/Urinary Tract: Adrenal glands are unremarkable. Kidneys are normal, without renal calculi, focal lesion, or hydronephrosis. Bladder is unremarkable. Stomach/Bowel: Stomach is within normal limits. Appendix appears normal. Multiple dilated loops of small bowel are seen throughout the abdomen and pelvis. A clear transition zone is not identified. Vascular/Lymphatic: Mild to moderate severity aortic calcification. No enlarged abdominal or pelvic lymph nodes. Reproductive: The prostate gland is mildly enlarged. Other: There is a 4.9 cm x 3.9 cm fat containing right inguinal hernia. An additional 4.4 cm x  2.8 cm fat containing left inguinal hernia is seen. Musculoskeletal: Multilevel degenerative changes seen throughout the lumbar spine. IMPRESSION: 1. Multiple dilated loops of small bowel throughout the abdomen and pelvis, consistent with a partial small bowel obstruction. 2. Bilateral fat containing inguinal hernias. 3. Mild to moderate severity aortic calcification. 4. Mildly enlarged prostate gland. Aortic Atherosclerosis (ICD10-I70.0). Electronically Signed   By: Virgina Norfolk M.D.   On: 07/01/2019 03:09    Procedures Procedures (including critical care time)  Medications Ordered in ED Medications  sodium chloride flush (NS) 0.9 % injection 3 mL (3 mLs Intravenous Not Given 07/01/19 0029)  sodium chloride 0.9 % bolus 1,000 mL (has no administration in time range)  fentaNYL (SUBLIMAZE) injection 50 mcg (has no administration in time range)  ondansetron (ZOFRAN) injection 4 mg (has no administration in time range)    ED Course  I have reviewed the triage vital signs and the nursing notes.  Pertinent labs & imaging results that were available during my care of the patient were reviewed by me and considered in my medical decision making (see chart for  details).    MDM Rules/Calculators/A&P                      Abdominal pain with nausea and vomiting.  No chest pain.  Vitals are stable.  Afebrile  Patient given IV fluids and symptom control.  Labs are reassuring with normal white blood cell count.  Mild hyponatremia.  Creatinine 1.48 without comparison. Lactate normal.   Most of patient's pain appears in his upper abdomen across his right upper quadrant epigastrium.  Ultrasound is not available.  CT scan will be obtained to evaluate gallbladder as well for other pathology.  CT shows normal-appearing gallbladder and appendix.  Does show multiple dilated bowel loops concerning for partial small bowel obstruction.  No transition point.  We will hydrate, keep n.p.o., hold off on NG at this point as he is not vomiting.  Patient discussed with Dr. Humphrey Rolls.  Serigo Coutts was evaluated in Emergency Department on 07/01/2019 for the symptoms described in the history of present illness. He was evaluated in the context of the global COVID-19 pandemic, which necessitated consideration that the patient might be at risk for infection with the SARS-CoV-2 virus that causes COVID-19. Institutional protocols and algorithms that pertain to the evaluation of patients at risk for COVID-19 are in a state of rapid change based on information released by regulatory bodies including the CDC and federal and state organizations. These policies and algorithms were followed during the patient's care in the ED.  Final Clinical Impression(s) / ED Diagnoses Final diagnoses:  Partial small bowel obstruction [facility])    Rx / DC Orders ED Discharge Orders    None       Malonie Tatum, Annie Main, MD 07/01/19 0451

## 2019-07-01 NOTE — Progress Notes (Signed)
[facility]  Multiple loose BMs today since contrast. Feels much better and having Lots of gas per RN.  Adv to clears. Go slow for now.  Repeat X-ray pending.  Curlene Labrum, MD

## 2019-07-02 ENCOUNTER — Inpatient Hospital Stay (HOSPITAL_COMMUNITY): Payer: Medicare PPO

## 2019-07-02 DIAGNOSIS — E876 Hypokalemia: Secondary | ICD-10-CM | POA: Diagnosis not present

## 2019-07-02 DIAGNOSIS — I10 Essential (primary) hypertension: Secondary | ICD-10-CM | POA: Diagnosis not present

## 2019-07-02 DIAGNOSIS — K56600 Partial intestinal obstruction, unspecified as to cause: Secondary | ICD-10-CM | POA: Diagnosis not present

## 2019-07-02 DIAGNOSIS — E871 Hypo-osmolality and hyponatremia: Secondary | ICD-10-CM | POA: Diagnosis not present

## 2019-07-02 LAB — MAGNESIUM: Magnesium: 2.1 mg/dL (ref 1.7–2.4)

## 2019-07-02 LAB — LIPID PANEL
Cholesterol: 112 mg/dL (ref 0–200)
HDL: 30 mg/dL — ABNORMAL LOW (ref >40)
LDL Cholesterol: 59 mg/dL (ref 0–99)
Total CHOL/HDL Ratio: 3.7 RATIO
Triglycerides: 114 mg/dL (ref <150)
VLDL: 23 mg/dL (ref 0–40)

## 2019-07-02 LAB — COMPREHENSIVE METABOLIC PANEL
ALT: 36 U/L (ref 0–44)
AST: 22 U/L (ref 15–41)
Albumin: 4 g/dL (ref 3.5–5.0)
Alkaline Phosphatase: 38 U/L (ref 38–126)
Anion gap: 10 (ref 5–15)
BUN: 31 mg/dL — ABNORMAL HIGH (ref 8–23)
CO2: 29 mmol/L (ref 22–32)
Calcium: 8.7 mg/dL — ABNORMAL LOW (ref 8.9–10.3)
Chloride: 98 mmol/L (ref 98–111)
Creatinine, Ser: 1.1 mg/dL (ref 0.61–1.24)
GFR calc Af Amer: >60 mL/min (ref >60)
GFR calc non Af Amer: >60 mL/min (ref >60)
Glucose, Bld: 96 mg/dL (ref 70–99)
Potassium: 4.2 mmol/L (ref 3.5–5.1)
Sodium: 137 mmol/L (ref 135–145)
Total Bilirubin: 1.2 mg/dL (ref 0.3–1.2)
Total Protein: 7.6 g/dL (ref 6.5–8.1)

## 2019-07-02 LAB — CBC
HCT: 46.2 % (ref 39.0–52.0)
Hemoglobin: 15.1 g/dL (ref 13.0–17.0)
MCH: 29.4 pg (ref 26.0–34.0)
MCHC: 32.7 g/dL (ref 30.0–36.0)
MCV: 90.1 fL (ref 80.0–100.0)
Platelets: 293 10*3/uL (ref 150–400)
RBC: 5.13 MIL/uL (ref 4.22–5.81)
RDW: 14.5 % (ref 11.5–15.5)
WBC: 7.1 10*3/uL (ref 4.0–10.5)
nRBC: 0 % (ref 0.0–0.2)

## 2019-07-02 LAB — PHOSPHORUS: Phosphorus: 2.2 mg/dL — ABNORMAL LOW (ref 2.5–4.6)

## 2019-07-02 MED ORDER — ENOXAPARIN SODIUM 60 MG/0.6ML ~~LOC~~ SOLN
55.0000 mg | SUBCUTANEOUS | Status: DC
  Administered 2019-07-02: 10:00:00 55 mg via SUBCUTANEOUS
  Filled 2019-07-02: qty 0.6

## 2019-07-02 MED ORDER — IOHEXOL 350 MG/ML SOLN
100.0000 mL | Freq: Once | INTRAVENOUS | Status: AC | PRN
  Administered 2019-07-02: 09:00:00 100 mL via INTRAVENOUS

## 2019-07-02 MED ORDER — K PHOS MONO-SOD PHOS DI & MONO 155-852-130 MG PO TABS
500.0000 mg | ORAL_TABLET | Freq: Two times a day (BID) | ORAL | Status: DC
  Administered 2019-07-02: 12:00:00 500 mg via ORAL
  Filled 2019-07-02: qty 2

## 2019-07-02 NOTE — Progress Notes (Signed)
Patient has had several loose bowel movements during shift. Patient has tolerated clears so far this shift.

## 2019-07-02 NOTE — Progress Notes (Signed)
[facility] Progress Note     Subjective: Had multiple Bms. Repeat Xray with contrast into colon. Improving clinically.   Objective: Vital signs in last 24 hours: Temp:  [98.1 F (36.7 C)-98.4 F (36.9 C)] 98.4 F (36.9 C) (04/13 0521) Pulse Rate:  [95-104] 95 (04/13 0521) Resp:  [20] 20 (04/13 0521) BP: (102-140)/(73-81) 140/80 (04/13 0521) SpO2:  [93 %-100 %] 93 % (04/13 0521) Last BM Date: 07/01/19  Intake/Output from previous day: 04/12 0701 - 04/13 0700 In: 205.6 [I.V.:4.4; IV Piggyback:201.2] Out: 8 [Urine:2; Stool:6] Intake/Output this shift: No intake/output data recorded.  General appearance: alert, cooperative and no distress Resp: normal work of breathing GI: soft, mildly distended, nontender Extremities: extremities normal, atraumatic, no cyanosis or edema  Lab Results:  Recent Labs    07/01/19 1328 07/02/19 0430  WBC 6.5 7.1  HGB 15.6 15.1  HCT 46.1 46.2  PLT 290 293   BMET Recent Labs    07/01/19 1328 07/02/19 0430  NA 133* 137  K 3.8 4.2  CL 96* 98  CO2 25 29  GLUCOSE 119* 96  BUN 32* 31*  CREATININE 1.20 1.10  CALCIUM 8.3* 8.7*   PT/INR No results for input(s): LABPROT, INR in the last 72 hours.  Personally reviewed Xray and contrast moved into the colon, some dilated small bowel remains  Studies/Results: DG Abd 1 View  Result Date: 07/02/2019 CLINICAL DATA:  Small bowel obstruction follow-up. EXAM: ABDOMEN - 1 VIEW COMPARISON:  07/01/2018 FINDINGS: Air and contrast are present throughout the colon. There is persistence of several air-filled dilated small bowel loops in the central abdomen measuring up to 5 cm in diameter without significant change. No free peritoneal air. Remainder of the exam is unchanged. IMPRESSION: Stable air-filled dilated small bowel loops over the central and left abdomen. Air and contrast throughout the colon. Findings may be due to ongoing partial small bowel obstruction. Electronically Signed    By: Marin Olp M.D.   On: 07/02/2019 09:23   DG Abd 1 View  Result Date: 07/01/2019 CLINICAL DATA:  Small-bowel obstruction, abdominal pain, cramping, bloating EXAM: ABDOMEN - 1 VIEW COMPARISON:  CT abdomen and pelvis 07/01/2019 FINDINGS: Numerous dilated loops of small bowel throughout the abdomen. Paucity of colonic gas. Small amount of excreted contrast material within the urinary bladder. Osseous structures unremarkable. IMPRESSION: Small bowel obstruction. Electronically Signed   By: Lavonia Dana M.D.   On: 07/01/2019 13:25   CT ANGIO CHEST PE W OR WO CONTRAST  Result Date: 07/02/2019 CLINICAL DATA:  Shortness of breath, elevated D-dimer, chest pain, evaluate for PE EXAM: CT ANGIOGRAPHY CHEST WITH CONTRAST TECHNIQUE: Multidetector CT imaging of the chest was performed using the standard protocol during bolus administration of intravenous contrast. Multiplanar CT image reconstructions and MIPs were obtained to evaluate the vascular anatomy. CONTRAST:  116mL OMNIPAQUE IOHEXOL 350 MG/ML SOLN COMPARISON:  None. FINDINGS: Cardiovascular: Satisfactory opacification of the pulmonary arteries to the segmental level. No evidence of pulmonary embolism. Normal heart size. Three-vessel coronary artery calcifications. No pericardial effusion. Mediastinum/Nodes: No enlarged mediastinal, hilar, or axillary lymph nodes. Thyroid gland, trachea, and esophagus demonstrate no significant findings. Lungs/Pleura: Lungs are clear. No pleural effusion or pneumothorax. Upper Abdomen: No acute abnormality.  Hepatic steatosis. Musculoskeletal: No chest wall abnormality. No acute or significant osseous findings. Review of the MIP images confirms the above findings. IMPRESSION: 1.  Negative examination for pulmonary embolism. 2.  Coronary artery disease. 3.  Hepatic steatosis. Electronically Signed   By: Cristie Hem  Laqueta Carina M.D.   On: 07/02/2019 09:34   CT ABDOMEN PELVIS W CONTRAST  Result Date: 07/01/2019 CLINICAL DATA:  Right  upper quadrant pain. EXAM: CT ABDOMEN AND PELVIS WITH CONTRAST TECHNIQUE: Multidetector CT imaging of the abdomen and pelvis was performed using the standard protocol following bolus administration of intravenous contrast. CONTRAST:  19mL OMNIPAQUE IOHEXOL 300 MG/ML  SOLN COMPARISON:  None. FINDINGS: Lower chest: Mild linear scarring and/or atelectasis is seen along the anteromedial aspect of the right middle lobe. Hepatobiliary: No focal liver abnormality is seen. No gallstones, gallbladder wall thickening, or biliary dilatation. Pancreas: Unremarkable. No pancreatic ductal dilatation or surrounding inflammatory changes. Spleen: Normal in size without focal abnormality. Adrenals/Urinary Tract: Adrenal glands are unremarkable. Kidneys are normal, without renal calculi, focal lesion, or hydronephrosis. Bladder is unremarkable. Stomach/Bowel: Stomach is within normal limits. Appendix appears normal. Multiple dilated loops of small bowel are seen throughout the abdomen and pelvis. A clear transition zone is not identified. Vascular/Lymphatic: Mild to moderate severity aortic calcification. No enlarged abdominal or pelvic lymph nodes. Reproductive: The prostate gland is mildly enlarged. Other: There is a 4.9 cm x 3.9 cm fat containing right inguinal hernia. An additional 4.4 cm x 2.8 cm fat containing left inguinal hernia is seen. Musculoskeletal: Multilevel degenerative changes seen throughout the lumbar spine. IMPRESSION: 1. Multiple dilated loops of small bowel throughout the abdomen and pelvis, consistent with a partial small bowel obstruction. 2. Bilateral fat containing inguinal hernias. 3. Mild to moderate severity aortic calcification. 4. Mildly enlarged prostate gland. Aortic Atherosclerosis (ICD10-I70.0). Electronically Signed   By: Virgina Norfolk M.D.   On: 07/01/2019 03:09   DG CHEST PORT 1 VIEW  Result Date: 07/01/2019 CLINICAL DATA:  Nausea with diaphoresis and dizziness and near-syncope. Small  bowel obstruction on recent CT. EXAM: PORTABLE CHEST 1 VIEW COMPARISON:  None. FINDINGS: Lungs are adequately inflated and otherwise clear. Cardiomediastinal silhouette and remainder of the chest is unremarkable. Contrast is present over the stomach. IMPRESSION: No active disease. Electronically Signed   By: Marin Olp M.D.   On: 07/01/2019 13:50   DG Abd Portable 1V-Small Bowel Obstruction Protocol-initial, 8 hr delay  Result Date: 07/01/2019 CLINICAL DATA:  8 hour delay EXAM: PORTABLE ABDOMEN - 1 VIEW COMPARISON:  07/01/2018 FINDINGS: Enteral contrast is present within the stomach and dilated small bowel. No contrast identified within the colon. Small bowel is dilated up to 5.3 cm. Excreted contrast in the urinary bladder. IMPRESSION: 1. No enteral contrast identified within the colon. 2. Persistent dilatation of small bowel consistent with bowel obstruction. Contrast is present within the stomach and dilated small bowel. Electronically Signed   By: Donavan Foil M.D.   On: 07/01/2019 21:33    Anti-infectives: Anti-infectives (From admission, onward)   None      Assessment/Plan: Mr. Blakeman is a 69 yo with a resolving what is likely ileus given no transition on CT and no prior surgeries. Improving. Soft diet ordered. -Diet as tolerated -Continue bowel regimen at home -Follow up with PCP    LOS: 1 day    Virl Cagey 07/02/2019

## 2019-07-02 NOTE — Plan of Care (Signed)

## 2019-07-02 NOTE — Discharge Summary (Signed)
Physician Discharge Summary  George Horton [id number] DOB: 1950-03-24 DOA: 07/01/2019  PCP: Beola Cord, FNP  Admit date: 07/01/2019 Discharge date: 07/02/2019  Admitted From: Home Disposition:  Home  Recommendations for Outpatient Follow-up:  1. Follow up with PCP in 1-2 weeks 2. Please obtain BMP/CBC in one week   Discharge Condition: Stable CODE STATUS: FULL Diet recommendation: Heart Healthy    Brief/Interim Summary: 69 y.o. male with medical history of hypertension, GERD presenting with 2-day history of abdominal pain with nausea and vomiting.  The patient states that he began having abdominal pain on 06/28/2019 after eating some microwave lasagna.  He subsequently had an episode of nausea and vomiting.  In the early morning on 06/29/2019, the the patient had another episode of nausea and vomiting.  He stated that he had some temporary relief of his abdominal pain and vomiting that day.  However, on 06/30/2019, the patient ate some potato soup and began having worsening abdominal pain again with another episode of emesis.  He tried taking some over-the-counter laxatives and suppositories without any success.  His last bowel movement was on 06/28/2019.  He denies any fevers, chills, chest pain, shortness breath, cough.hemoptysis, diarrhea, hematochezia, melena, headache, neck pain.  Patient takes 2 Aleve daily for arthritis pain for the last 5 years. In the emergency department, patient was afebrile hemodynamically stable with oxygen saturation 95% room air.  Sodium 128, potassium 3.3, serum creatinine 1.48.  LFTs were unremarkable.  WBC 8.4, hemoglobin 15.4, platelets 264,000.  CT abdomen/pelvis showed multiple dilated loops of small bowel throughout the abdomen consistent with a partial small bowel obstruction.  UA was negative for pyuria.  EKG shows sinus rhythm with nonspecific ST-T wave changes. Patient was placed on bowel rest and IVF initially.  General surgery was consulted.   Gastrograffin study/SBFT was ordered and showed Persistent dilatation of small bowel consistent with bowel Obstruction.  Fortunately, the patient subsequently had numerous BMs with improvement of his abd pain and distension.  His diet was advanced which he tolerated  Discharge Diagnoses:  Small bowel obstruction  -General surgery consult appreciated -4/12--SBFT--Persistent dilatation of small bowel consistent with bowel Obstruction -07/01/19--pt had multiple BMs -07/02/19--AXR--Air and contrast throughout the colon. -diet advanced which he tolerated -IV fluids -Judicious IV opioids -Increase activity  Essential hypertension -Holding lisinopril until able to take p.o.--restart after d/c -hold HCTZ due to volume depletion -Hydralazine prn SBP >180  Hyponatremia -Secondary to volume depletion -Start IV fluids--improved  Hypokalemia/hypophosphatemia -Replete -Check magnesium-2.1  GERD -Continue Protonix  Vagal Reaction -4/12--had sob, nausea, near syncope, diaphoresis after gastrograffin -EKG, Troponins, CTA chest unremarkable    Discharge Instructions   Allergies as of 07/02/2019      Reactions   Gastrografin [diatrizoate Meglumine & Sodium] Other (See Comments)   Patient had a severe vagal response. (nausea, vomiting, diaphoretic, weakness)      Medication List    STOP taking these medications   naproxen sodium 220 MG tablet Commonly known as: ALEVE     TAKE these medications   hydrochlorothiazide 12.5 MG tablet Commonly known as: HYDRODIURIL Take 12.5 mg by mouth every morning.   lisinopril 20 MG tablet Commonly known as: ZESTRIL Take 20 mg by mouth daily.   omeprazole 40 MG capsule Commonly known as: PRILOSEC Take 40 mg by mouth every morning.       Allergies  Allergen Reactions  . Gastrografin [Diatrizoate Meglumine & Sodium] Other (See Comments)    Patient had a severe vagal response. (nausea, vomiting, diaphoretic, weakness)  Consultations:  General surgery   Procedures/Studies: DG Abd 1 View  Result Date: 07/02/2019 CLINICAL DATA:  Small bowel obstruction follow-up. EXAM: ABDOMEN - 1 VIEW COMPARISON:  07/01/2018 FINDINGS: Air and contrast are present throughout the colon. There is persistence of several air-filled dilated small bowel loops in the central abdomen measuring up to 5 cm in diameter without significant change. No free peritoneal air. Remainder of the exam is unchanged. IMPRESSION: Stable air-filled dilated small bowel loops over the central and left abdomen. Air and contrast throughout the colon. Findings may be due to ongoing partial small bowel obstruction. Electronically Signed   By: Marin Olp M.D.   On: 07/02/2019 09:23   DG Abd 1 View  Result Date: 07/01/2019 CLINICAL DATA:  Small-bowel obstruction, abdominal pain, cramping, bloating EXAM: ABDOMEN - 1 VIEW COMPARISON:  CT abdomen and pelvis 07/01/2019 FINDINGS: Numerous dilated loops of small bowel throughout the abdomen. Paucity of colonic gas. Small amount of excreted contrast material within the urinary bladder. Osseous structures unremarkable. IMPRESSION: Small bowel obstruction. Electronically Signed   By: Lavonia Dana M.D.   On: 07/01/2019 13:25   CT ANGIO CHEST PE W OR WO CONTRAST  Result Date: 07/02/2019 CLINICAL DATA:  Shortness of breath, elevated D-dimer, chest pain, evaluate for PE EXAM: CT ANGIOGRAPHY CHEST WITH CONTRAST TECHNIQUE: Multidetector CT imaging of the chest was performed using the standard protocol during bolus administration of intravenous contrast. Multiplanar CT image reconstructions and MIPs were obtained to evaluate the vascular anatomy. CONTRAST:  176mL OMNIPAQUE IOHEXOL 350 MG/ML SOLN COMPARISON:  None. FINDINGS: Cardiovascular: Satisfactory opacification of the pulmonary arteries to the segmental level. No evidence of pulmonary embolism. Normal heart size. Three-vessel coronary artery calcifications. No  pericardial effusion. Mediastinum/Nodes: No enlarged mediastinal, hilar, or axillary lymph nodes. Thyroid gland, trachea, and esophagus demonstrate no significant findings. Lungs/Pleura: Lungs are clear. No pleural effusion or pneumothorax. Upper Abdomen: No acute abnormality.  Hepatic steatosis. Musculoskeletal: No chest wall abnormality. No acute or significant osseous findings. Review of the MIP images confirms the above findings. IMPRESSION: 1.  Negative examination for pulmonary embolism. 2.  Coronary artery disease. 3.  Hepatic steatosis. Electronically Signed   By: Eddie Candle M.D.   On: 07/02/2019 09:34   CT ABDOMEN PELVIS W CONTRAST  Result Date: 07/01/2019 CLINICAL DATA:  Right upper quadrant pain. EXAM: CT ABDOMEN AND PELVIS WITH CONTRAST TECHNIQUE: Multidetector CT imaging of the abdomen and pelvis was performed using the standard protocol following bolus administration of intravenous contrast. CONTRAST:  143mL OMNIPAQUE IOHEXOL 300 MG/ML  SOLN COMPARISON:  None. FINDINGS: Lower chest: Mild linear scarring and/or atelectasis is seen along the anteromedial aspect of the right middle lobe. Hepatobiliary: No focal liver abnormality is seen. No gallstones, gallbladder wall thickening, or biliary dilatation. Pancreas: Unremarkable. No pancreatic ductal dilatation or surrounding inflammatory changes. Spleen: Normal in size without focal abnormality. Adrenals/Urinary Tract: Adrenal glands are unremarkable. Kidneys are normal, without renal calculi, focal lesion, or hydronephrosis. Bladder is unremarkable. Stomach/Bowel: Stomach is within normal limits. Appendix appears normal. Multiple dilated loops of small bowel are seen throughout the abdomen and pelvis. A clear transition zone is not identified. Vascular/Lymphatic: Mild to moderate severity aortic calcification. No enlarged abdominal or pelvic lymph nodes. Reproductive: The prostate gland is mildly enlarged. Other: There is a 4.9 cm x 3.9 cm fat  containing right inguinal hernia. An additional 4.4 cm x 2.8 cm fat containing left inguinal hernia is seen. Musculoskeletal: Multilevel degenerative changes seen throughout the lumbar spine. IMPRESSION: 1.  Multiple dilated loops of small bowel throughout the abdomen and pelvis, consistent with a partial small bowel obstruction. 2. Bilateral fat containing inguinal hernias. 3. Mild to moderate severity aortic calcification. 4. Mildly enlarged prostate gland. Aortic Atherosclerosis (ICD10-I70.0). Electronically Signed   By: Virgina Norfolk M.D.   On: 07/01/2019 03:09   DG CHEST PORT 1 VIEW  Result Date: 07/01/2019 CLINICAL DATA:  Nausea with diaphoresis and dizziness and near-syncope. Small bowel obstruction on recent CT. EXAM: PORTABLE CHEST 1 VIEW COMPARISON:  None. FINDINGS: Lungs are adequately inflated and otherwise clear. Cardiomediastinal silhouette and remainder of the chest is unremarkable. Contrast is present over the stomach. IMPRESSION: No active disease. Electronically Signed   By: Marin Olp M.D.   On: 07/01/2019 13:50   DG Abd Portable 1V-Small Bowel Obstruction Protocol-initial, 8 hr delay  Result Date: 07/01/2019 CLINICAL DATA:  8 hour delay EXAM: PORTABLE ABDOMEN - 1 VIEW COMPARISON:  07/01/2018 FINDINGS: Enteral contrast is present within the stomach and dilated small bowel. No contrast identified within the colon. Small bowel is dilated up to 5.3 cm. Excreted contrast in the urinary bladder. IMPRESSION: 1. No enteral contrast identified within the colon. 2. Persistent dilatation of small bowel consistent with bowel obstruction. Contrast is present within the stomach and dilated small bowel. Electronically Signed   By: Donavan Foil M.D.   On: 07/01/2019 21:33        Discharge Exam: Vitals:   07/01/19 2103 07/02/19 0521  BP: 123/73 140/80  Pulse: 95 95  Resp: 20 20  Temp: 98.1 F (36.7 C) 98.4 F (36.9 C)  SpO2: 100% 93%   Vitals:   07/01/19 1507 07/01/19 1703  07/01/19 2103 07/02/19 0521  BP: 102/81 108/79 123/73 140/80  Pulse:  (!) 104 95 95  Resp: 20 20 20 20   Temp: 98.4 F (36.9 C) 98.4 F (36.9 C) 98.1 F (36.7 C) 98.4 F (36.9 C)  TempSrc: Oral Oral Oral Oral  SpO2: 97% 96% 100% 93%  Weight:      Height:        General: Pt is alert, awake, not in acute distress Cardiovascular: RRR, S1/S2 +, no rubs, no gallops Respiratory: CTA bilaterally, no wheezing, no rhonchi Abdominal: Soft, NT, ND, bowel sounds + Extremities: no edema, no cyanosis   The results of significant diagnostics from this hospitalization (including imaging, microbiology, ancillary and laboratory) are listed below for reference.    Significant Diagnostic Studies: DG Abd 1 View  Result Date: 07/02/2019 CLINICAL DATA:  Small bowel obstruction follow-up. EXAM: ABDOMEN - 1 VIEW COMPARISON:  07/01/2018 FINDINGS: Air and contrast are present throughout the colon. There is persistence of several air-filled dilated small bowel loops in the central abdomen measuring up to 5 cm in diameter without significant change. No free peritoneal air. Remainder of the exam is unchanged. IMPRESSION: Stable air-filled dilated small bowel loops over the central and left abdomen. Air and contrast throughout the colon. Findings may be due to ongoing partial small bowel obstruction. Electronically Signed   By: Marin Olp M.D.   On: 07/02/2019 09:23   DG Abd 1 View  Result Date: 07/01/2019 CLINICAL DATA:  Small-bowel obstruction, abdominal pain, cramping, bloating EXAM: ABDOMEN - 1 VIEW COMPARISON:  CT abdomen and pelvis 07/01/2019 FINDINGS: Numerous dilated loops of small bowel throughout the abdomen. Paucity of colonic gas. Small amount of excreted contrast material within the urinary bladder. Osseous structures unremarkable. IMPRESSION: Small bowel obstruction. Electronically Signed   By: Crist Infante.D.  On: 07/01/2019 13:25   CT ANGIO CHEST PE W OR WO CONTRAST  Result Date:  07/02/2019 CLINICAL DATA:  Shortness of breath, elevated D-dimer, chest pain, evaluate for PE EXAM: CT ANGIOGRAPHY CHEST WITH CONTRAST TECHNIQUE: Multidetector CT imaging of the chest was performed using the standard protocol during bolus administration of intravenous contrast. Multiplanar CT image reconstructions and MIPs were obtained to evaluate the vascular anatomy. CONTRAST:  172mL OMNIPAQUE IOHEXOL 350 MG/ML SOLN COMPARISON:  None. FINDINGS: Cardiovascular: Satisfactory opacification of the pulmonary arteries to the segmental level. No evidence of pulmonary embolism. Normal heart size. Three-vessel coronary artery calcifications. No pericardial effusion. Mediastinum/Nodes: No enlarged mediastinal, hilar, or axillary lymph nodes. Thyroid gland, trachea, and esophagus demonstrate no significant findings. Lungs/Pleura: Lungs are clear. No pleural effusion or pneumothorax. Upper Abdomen: No acute abnormality.  Hepatic steatosis. Musculoskeletal: No chest wall abnormality. No acute or significant osseous findings. Review of the MIP images confirms the above findings. IMPRESSION: 1.  Negative examination for pulmonary embolism. 2.  Coronary artery disease. 3.  Hepatic steatosis. Electronically Signed   By: Eddie Candle M.D.   On: 07/02/2019 09:34   CT ABDOMEN PELVIS W CONTRAST  Result Date: 07/01/2019 CLINICAL DATA:  Right upper quadrant pain. EXAM: CT ABDOMEN AND PELVIS WITH CONTRAST TECHNIQUE: Multidetector CT imaging of the abdomen and pelvis was performed using the standard protocol following bolus administration of intravenous contrast. CONTRAST:  172mL OMNIPAQUE IOHEXOL 300 MG/ML  SOLN COMPARISON:  None. FINDINGS: Lower chest: Mild linear scarring and/or atelectasis is seen along the anteromedial aspect of the right middle lobe. Hepatobiliary: No focal liver abnormality is seen. No gallstones, gallbladder wall thickening, or biliary dilatation. Pancreas: Unremarkable. No pancreatic ductal dilatation or  surrounding inflammatory changes. Spleen: Normal in size without focal abnormality. Adrenals/Urinary Tract: Adrenal glands are unremarkable. Kidneys are normal, without renal calculi, focal lesion, or hydronephrosis. Bladder is unremarkable. Stomach/Bowel: Stomach is within normal limits. Appendix appears normal. Multiple dilated loops of small bowel are seen throughout the abdomen and pelvis. A clear transition zone is not identified. Vascular/Lymphatic: Mild to moderate severity aortic calcification. No enlarged abdominal or pelvic lymph nodes. Reproductive: The prostate gland is mildly enlarged. Other: There is a 4.9 cm x 3.9 cm fat containing right inguinal hernia. An additional 4.4 cm x 2.8 cm fat containing left inguinal hernia is seen. Musculoskeletal: Multilevel degenerative changes seen throughout the lumbar spine. IMPRESSION: 1. Multiple dilated loops of small bowel throughout the abdomen and pelvis, consistent with a partial small bowel obstruction. 2. Bilateral fat containing inguinal hernias. 3. Mild to moderate severity aortic calcification. 4. Mildly enlarged prostate gland. Aortic Atherosclerosis (ICD10-I70.0). Electronically Signed   By: Virgina Norfolk M.D.   On: 07/01/2019 03:09   DG CHEST PORT 1 VIEW  Result Date: 07/01/2019 CLINICAL DATA:  Nausea with diaphoresis and dizziness and near-syncope. Small bowel obstruction on recent CT. EXAM: PORTABLE CHEST 1 VIEW COMPARISON:  None. FINDINGS: Lungs are adequately inflated and otherwise clear. Cardiomediastinal silhouette and remainder of the chest is unremarkable. Contrast is present over the stomach. IMPRESSION: No active disease. Electronically Signed   By: Marin Olp M.D.   On: 07/01/2019 13:50   DG Abd Portable 1V-Small Bowel Obstruction Protocol-initial, 8 hr delay  Result Date: 07/01/2019 CLINICAL DATA:  8 hour delay EXAM: PORTABLE ABDOMEN - 1 VIEW COMPARISON:  07/01/2018 FINDINGS: Enteral contrast is present within the stomach and  dilated small bowel. No contrast identified within the colon. Small bowel is dilated up to 5.3 cm. Excreted contrast  in the urinary bladder. IMPRESSION: 1. No enteral contrast identified within the colon. 2. Persistent dilatation of small bowel consistent with bowel obstruction. Contrast is present within the stomach and dilated small bowel. Electronically Signed   By: Donavan Foil M.D.   On: 07/01/2019 21:33     Microbiology: Recent Results (from the past 240 hour(s))  SARS CORONAVIRUS 2 ([place] 6-24 HRS) Nasopharyngeal Nasopharyngeal Swab     Status: None   Collection Time: 07/01/19  4:20 AM   Specimen: Nasopharyngeal Swab  Result Value Ref Range Status   SARS Coronavirus 2 NEGATIVE NEGATIVE Final    Comment: (NOTE) SARS-CoV-2 target nucleic acids are NOT DETECTED. The SARS-CoV-2 RNA is generally detectable in upper and lower respiratory specimens during the acute phase of infection. Negative results do not preclude SARS-CoV-2 infection, do not rule out co-infections with other pathogens, and should not be used as the sole basis for treatment or other patient management decisions. Negative results must be combined with clinical observations, patient history, and epidemiological information. The expected result is Negative. Fact Sheet for Patients: SugarRoll.be Fact Sheet for Healthcare Providers: https://www.woods-mathews.com/ This test is not yet approved or cleared by the [place] FDA and  has been authorized for detection and/or diagnosis of SARS-CoV-2 by FDA under an Emergency Use Authorization (EUA). This EUA will remain  in effect (meaning this test can be used) for the duration of the COVID-19 declaration under Section 56 4(b)(1) of the Act, 21 U.S.C. section 360bbb-3(b)(1), unless the authorization is terminated or revoked sooner. Performed at [facility], [place] [address], [place], [place] [address]      Labs: Basic  Metabolic Panel: Recent Labs  Lab 07/01/19 0111 07/01/19 0111 07/01/19 1328 07/02/19 0430  NA 128*  --  133* 137  K 3.3*   < > 3.8 4.2  CL 94*  --  96* 98  CO2 24  --  25 29  GLUCOSE 117*  --  119* 96  BUN 37*  --  32* 31*  CREATININE 1.48*  --  1.20 1.10  CALCIUM 8.0*  --  8.3* 8.7*  MG  --   --   --  2.1  PHOS  --   --   --  2.2*   < > = values in this interval not displayed.   Liver Function Tests: Recent Labs  Lab 07/01/19 0111 07/02/19 0430  AST 25 22  ALT 39 36  ALKPHOS 38 38  BILITOT 1.0 1.2  PROT 7.8 7.6  ALBUMIN 4.3 4.0   Recent Labs  Lab 07/01/19 0111  LIPASE 21   No results for input(s): AMMONIA in the last 168 hours. CBC: Recent Labs  Lab 07/01/19 0111 07/01/19 1328 07/02/19 0430  WBC 8.4 6.5 7.1  HGB 15.4 15.6 15.1  HCT 45.6 46.1 46.2  MCV 87.5 87.1 90.1  PLT 264 290 293   Cardiac Enzymes: No results for input(s): CKTOTAL, CKMB, CKMBINDEX, TROPONINI in the last 168 hours. BNP: Invalid input(s): POCBNP CBG: Recent Labs  Lab 07/01/19 1308  GLUCAP 104*    Time coordinating discharge:  36 minutes  Signed:  Orson Eva, DO [facility] Pager: [phone] 07/02/2019, 11:40 AM

## 2019-07-03 ENCOUNTER — Ambulatory Visit: Payer: Medicare PPO

## 2019-07-03 ENCOUNTER — Other Ambulatory Visit: Payer: Self-pay

## 2019-07-03 LAB — HEMOGLOBIN A1C
Hgb A1c MFr Bld: 6.4 % — ABNORMAL HIGH (ref 4.8–5.6)
Mean Plasma Glucose: 137 mg/dL

## 2019-10-02 ENCOUNTER — Telehealth: Payer: Self-pay

## 2019-10-02 NOTE — Telephone Encounter (Signed)
AS, pt would like to know if his apt is via telephone. Pt is scheduled for 10/07/19. Pt received an automated call from our office and pt wanted to make sure his apt was still via telephone.

## 2019-10-02 NOTE — Telephone Encounter (Signed)
Called pt back and he requested to have his visit via telephone for 10/07/19.  Confirmed phone number to call pt on.

## 2019-10-07 ENCOUNTER — Ambulatory Visit: Payer: Medicare PPO

## 2019-10-07 ENCOUNTER — Other Ambulatory Visit: Payer: Self-pay

## 2019-10-07 ENCOUNTER — Ambulatory Visit (INDEPENDENT_AMBULATORY_CARE_PROVIDER_SITE_OTHER): Payer: Medicare PPO | Admitting: *Deleted

## 2019-10-07 DIAGNOSIS — Z1211 Encounter for screening for malignant neoplasm of colon: Secondary | ICD-10-CM

## 2019-10-07 MED ORDER — NA SULFATE-K SULFATE-MG SULF 17.5-3.13-1.6 GM/177ML PO SOLN: 1.0000 | Freq: Once | ORAL | 0 refills | Status: AC

## 2019-10-07 NOTE — Progress Notes (Signed)
Ok to schedule on conscious sedation. ASA II (BMI >30; <40)

## 2019-10-07 NOTE — Patient Instructions (Signed)
George Horton  [birth year] MRN: [id number]     Procedure Date: 11/11/2019 Time to register: 8:15 am Place to register: [facility] Procedure Time: 9:45 am Scheduled provider: Dr. Abbey Chatters    PREPARATION FOR COLONOSCOPY WITH SUPREP BOWEL PREP KIT  Note: Suprep Bowel Prep Kit is a split-dose (2day) regimen. Consumption of BOTH 6-ounce bottles is required for a complete prep.  Please notify us immediately if you are diabetic, take iron supplements, or if you are on Coumadin or any other blood thinners.  Please hold the following medications: n/a                                                                                                                                                  2 DAYS BEFORE PROCEDURE:  DATE: 11/09/2019   DAY: Saturday Begin clear liquid diet AFTER your lunch meal. NO SOLID FOODS after this point.  1 DAY BEFORE PROCEDURE:  DATE: 11/10/2019   DAY: Sunday Continue clear liquids the entire day - NO SOLID FOOD.   Diabetic medications adjustments for today: n/a  At 6:00pm: Complete steps 1 through 4 below, using ONE (1) 6-ounce bottle, before going to bed. Step 1:  Pour ONE (1) 6-ounce bottle of SUPREP liquid into the mixing container.  Step 2:  Add cool drinking water to the 16 ounce line on the container and mix.  Note: Dilute the solution concentrate as directed prior to use. Step 3:  DRINK ALL the liquid in the container. Step 4:  You MUST drink an additional two (2) or more 16 ounce containers of water over the next one (1) hour.   Continue clear liquids.  DAY OF PROCEDURE:   DATE: 11/11/2019   DAY: Monday If you take medications for your heart, blood pressure, or breathing, you may take these medications.  Diabetic medications adjustments for today: n/a  5 hours before your procedure at 4:45 am : Step 1:  Pour ONE (1) 6-ounce bottle of SUPREP liquid into the mixing container.  Step 2:  Add cool drinking water to the 16 ounce line on the  container and mix.  Note: Dilute the solution concentrate as directed prior to use. Step 3:  DRINK ALL the liquid in the container. Step 4:  You MUST drink an additional two (2) or more 16 ounce containers of water over the next one (1) hour. You MUST complete the final glass of water at least 3 hours before your colonoscopy. Nothing by mouth past 6:45 am.  You may take your morning medications with sip of water unless we have instructed otherwise.    Please see below for Dietary Information.  CLEAR LIQUIDS INCLUDE:  Water Jello (NOT red in color)   Ice Popsicles (NOT red in color)   Tea (sugar ok, no milk/cream) Powdered fruit flavored drinks  Coffee (sugar ok, no  milk/cream) Gatorade/ Lemonade/ Kool-Aid  (NOT red in color)   Juice: apple, white grape, white cranberry Soft drinks  Clear bullion, consomme, broth (fat free beef/chicken/vegetable)  Carbonated beverages (any kind)  Strained chicken noodle soup Hard Candy   Remember: Clear liquids are liquids that will allow you to see your fingers on the other side of a clear glass. Be sure liquids are NOT red in color, and not cloudy, but CLEAR.  DO NOT EAT OR DRINK ANY OF THE FOLLOWING:  Dairy products of any kind   Cranberry juice Tomato juice / V8 juice   Grapefruit juice Orange juice     Red grape juice  Do not eat any solid foods, including such foods as: cereal, oatmeal, yogurt, fruits, vegetables, creamed soups, eggs, bread, crackers, pureed foods in a blender, etc.   HELPFUL HINTS FOR DRINKING PREP SOLUTION:   Make sure prep is extremely cold. Mix and refrigerate the the morning of the prep. You may also put in the freezer.   You may try mixing some Crystal Light or Country Time Lemonade if you prefer. Mix in small amounts; add more if necessary.  Try drinking through a straw  Rinse mouth with water or a mouthwash between glasses, to remove after-taste.  Try sipping on a cold beverage /ice/ popsicles between glasses of  prep.  Place a piece of sugar-free hard candy in mouth between glasses.  If you become nauseated, try consuming smaller amounts, or stretch out the time between glasses. Stop for 30-60 minutes, then slowly start back drinking.     OTHER INSTRUCTIONS  You will need a responsible adult at least 67 years of age to accompany you and drive you home. This person must remain in the waiting room during your procedure. The hospital will cancel your procedure if you do not have a responsible adult with you.   1. Wear loose fitting clothing that is easily removed. 2. Leave jewelry and other valuables at home.  3. Remove all body piercing jewelry and leave at home. 4. Total time from sign-in until discharge is approximately 2-3 hours. 5. You should go home directly after your procedure and rest. You can resume normal activities the day after your procedure. 6. The day of your procedure you should not:  Drive  Make legal decisions  Operate machinery  Drink alcohol  Return to work   You may call the office (Dept: [phone]) before 5:00pm, or page the doctor on call [phone]) after 5:00pm, for further instructions, if necessary.   Insurance Information YOU WILL NEED TO CHECK WITH YOUR INSURANCE COMPANY FOR THE BENEFITS OF COVERAGE YOU HAVE FOR THIS PROCEDURE.  UNFORTUNATELY, NOT ALL INSURANCE COMPANIES HAVE BENEFITS TO COVER ALL OR PART OF THESE TYPES OF PROCEDURES.  IT IS YOUR RESPONSIBILITY TO CHECK YOUR BENEFITS, HOWEVER, WE WILL BE GLAD TO ASSIST YOU WITH ANY CODES YOUR INSURANCE COMPANY MAY NEED.    PLEASE NOTE THAT MOST INSURANCE COMPANIES WILL NOT COVER A SCREENING COLONOSCOPY FOR PEOPLE UNDER THE AGE OF 50  IF YOU HAVE BC[employer] INSURANCE, YOU MAY HAVE BENEFITS FOR A SCREENING COLONOSCOPY BUT IF POLYPS ARE FOUND THE DIAGNOSIS WILL CHANGE AND THEN YOU MAY HAVE A DEDUCTIBLE THAT WILL NEED TO BE MET. SO PLEASE MAKE SURE YOU CHECK YOUR BENEFITS FOR A SCREENING COLONOSCOPY AS WELL AS A  DIAGNOSTIC COLONOSCOPY.

## 2019-10-07 NOTE — Progress Notes (Signed)
Pt remembered that he is taking Metamucil.  Added to med list.

## 2019-10-07 NOTE — Progress Notes (Addendum)
Gastroenterology Pre-Procedure Review  Request Date: 10/07/2019 Requesting Physician: Beola Cord, NP @ [address] Last TCS 11 years ago, pt could not remember physician, Pt said it was done in [place], no polyps per pt  PATIENT REVIEW QUESTIONS: The patient responded to the following health history questions as indicated:    1. Diabetes Melitis: no 2. Joint replacements in the past 12 months: no 3. Major health problems in the past 3 months: yes, partial small bowel obstruction/ seen at [facility] 4. Has an artificial valve or MVP: no 5. Has a defibrillator: no 6. Has been advised in past to take antibiotics in advance of a procedure like teeth cleaning: no 7. Family history of colon cancer: no 8. Alcohol Use: no 9. Illicit drug Use: no 10. History of sleep apnea: no 11. History of coronary artery or other vascular stents placed within the last 12 months: no 12. History of any prior anesthesia complications: no 13. There is no height or weight on file to calculate BMI. ht: 5'9 wt: 232 lbs    MEDICATIONS & ALLERGIES:    Patient reports the following regarding taking any blood thinners:   Plavix? no Aspirin? yes Coumadin? no Brilinta? no Xarelto? no Eliquis? no Pradaxa? no Savaysa? no Effient? no  Patient confirms/reports the following medications:  Current Outpatient Medications  Medication Sig Dispense Refill  . aspirin EC 81 MG tablet Take 81 mg by mouth daily. Swallow whole.    . hydrochlorothiazide (HYDRODIURIL) 12.5 MG tablet Take 12.5 mg by mouth every morning.    Marland Kitchen lisinopril (ZESTRIL) 20 MG tablet Take 20 mg by mouth daily.    . naproxen sodium (ALEVE) 220 MG tablet Take 220 mg by mouth as needed. Takes 2 tablets as needed.    Marland Kitchen omeprazole (PRILOSEC) 40 MG capsule Take 40 mg by mouth every morning.     No current facility-administered medications for this visit.    Patient confirms/reports the following allergies:  Allergies  Allergen Reactions  .  Gastrografin [Diatrizoate Meglumine & Sodium] Other (See Comments)    Patient had a severe vagal response. (nausea, vomiting, diaphoretic, weakness)    No orders of the defined types were placed in this encounter.   AUTHORIZATION INFORMATION Primary Insurance: [employer],  [place] #: [id number] Pre-Cert / Josem Kaufmann required: Yes, approved from 11/11/19-12/11/19 per Valentina Shaggy Pre-Cert / Josem Kaufmann #:[id number]  SCHEDULE INFORMATION: Procedure has been scheduled as follows:  Date: 11/11/2019, Time: 9:45 Location: [facility] with Dr. Abbey Chatters  This Gastroenterology Pre-Precedure Review Form is being routed to the following provider(s): Walden Field, NP

## 2019-10-09 ENCOUNTER — Other Ambulatory Visit: Payer: Self-pay | Admitting: *Deleted

## 2019-10-09 ENCOUNTER — Telehealth: Payer: Self-pay | Admitting: *Deleted

## 2019-10-09 NOTE — Telephone Encounter (Signed)
Called [employer] and received auth from 11/11/19-12/11/19 per Odaly M.  Auth #: [id number]

## 2019-10-09 NOTE — Progress Notes (Signed)
Pt states that he has been vaccinated but was a phone visit so unable to receive copy of vaccination record.

## 2019-10-29 ENCOUNTER — Encounter (HOSPITAL_COMMUNITY)
Admission: RE | Admit: 2019-10-29 | Discharge: 2019-10-29 | Disposition: A | Discharge status: Home / Self Care | Payer: Medicare PPO | Source: Ambulatory Visit | Attending: Internal Medicine | Admitting: Internal Medicine

## 2019-10-29 ENCOUNTER — Other Ambulatory Visit: Payer: Self-pay

## 2019-11-08 ENCOUNTER — Other Ambulatory Visit (HOSPITAL_COMMUNITY)
Admission: RE | Admit: 2019-11-08 | Discharge: 2019-11-08 | Disposition: A | Discharge status: Home / Self Care | Payer: Medicare PPO | Source: Ambulatory Visit | Attending: Internal Medicine | Admitting: Internal Medicine

## 2019-11-08 ENCOUNTER — Other Ambulatory Visit: Payer: Self-pay

## 2019-11-08 DIAGNOSIS — Z20822 Contact with and (suspected) exposure to covid-19: Secondary | ICD-10-CM | POA: Insufficient documentation

## 2019-11-08 DIAGNOSIS — Z01812 Encounter for preprocedural laboratory examination: Secondary | ICD-10-CM | POA: Insufficient documentation

## 2019-11-09 LAB — SARS CORONAVIRUS 2 (TAT 6-24 HRS): SARS Coronavirus 2: NEGATIVE

## 2019-11-11 ENCOUNTER — Encounter (HOSPITAL_COMMUNITY)
Admission: RE | Disposition: A | Discharge status: Home / Self Care | Payer: Self-pay | Source: Home / Self Care | Attending: Internal Medicine

## 2019-11-11 ENCOUNTER — Other Ambulatory Visit: Payer: Self-pay

## 2019-11-11 ENCOUNTER — Ambulatory Visit (HOSPITAL_COMMUNITY)
Admission: RE | Admit: 2019-11-11 | Discharge: 2019-11-11 | Disposition: A | Discharge status: Home / Self Care | Payer: Medicare PPO | Attending: Internal Medicine | Admitting: Internal Medicine

## 2019-11-11 ENCOUNTER — Ambulatory Visit (HOSPITAL_COMMUNITY): Payer: Medicare PPO | Admitting: Anesthesiology

## 2019-11-11 DIAGNOSIS — Z1211 Encounter for screening for malignant neoplasm of colon: Secondary | ICD-10-CM | POA: Insufficient documentation

## 2019-11-11 DIAGNOSIS — K5730 Diverticulosis of large intestine without perforation or abscess without bleeding: Secondary | ICD-10-CM | POA: Diagnosis not present

## 2019-11-11 DIAGNOSIS — K635 Polyp of colon: Secondary | ICD-10-CM | POA: Diagnosis not present

## 2019-11-11 DIAGNOSIS — D123 Benign neoplasm of transverse colon: Secondary | ICD-10-CM | POA: Insufficient documentation

## 2019-11-11 DIAGNOSIS — I10 Essential (primary) hypertension: Secondary | ICD-10-CM | POA: Diagnosis not present

## 2019-11-11 DIAGNOSIS — K648 Other hemorrhoids: Secondary | ICD-10-CM | POA: Diagnosis not present

## 2019-11-11 DIAGNOSIS — Z79899 Other long term (current) drug therapy: Secondary | ICD-10-CM | POA: Diagnosis not present

## 2019-11-11 DIAGNOSIS — Z7982 Long term (current) use of aspirin: Secondary | ICD-10-CM | POA: Diagnosis not present

## 2019-11-11 HISTORY — PX: COLONOSCOPY WITH PROPOFOL: SHX5780

## 2019-11-11 HISTORY — PX: POLYPECTOMY: SHX5525

## 2019-11-11 SURGERY — COLONOSCOPY WITH PROPOFOL
Anesthesia: General

## 2019-11-11 MED ORDER — LACTATED RINGERS IV SOLN: INTRAVENOUS | Status: DC | PRN

## 2019-11-11 MED ORDER — PROPOFOL 10 MG/ML IV BOLUS
INTRAVENOUS | Status: DC | PRN
  Administered 2019-11-11: 150 mg via INTRAVENOUS
  Administered 2019-11-11: 20 mg via INTRAVENOUS

## 2019-11-11 MED ORDER — CHLORHEXIDINE GLUCONATE CLOTH 2 % EX PADS: 6.0000 | MEDICATED_PAD | Freq: Once | CUTANEOUS | Status: DC

## 2019-11-11 MED ORDER — PROPOFOL 500 MG/50ML IV EMUL
INTRAVENOUS | Status: DC | PRN
  Administered 2019-11-11 (×2): 100 ug/kg/min via INTRAVENOUS

## 2019-11-11 MED ORDER — STERILE WATER FOR IRRIGATION IR SOLN
Status: DC | PRN
  Administered 2019-11-11: 1.5 mL

## 2019-11-11 NOTE — Anesthesia Preprocedure Evaluation (Addendum)
Anesthesia Evaluation  Patient identified by MRN, date of birth, ID band Patient awake    Reviewed: Allergy & Precautions, H&P , NPO status , Patient's Chart, lab work & pertinent test results, reviewed documented beta blocker date and time   Airway Mallampati: II  TM Distance: >3 FB Neck ROM: Full    Dental no notable dental hx. (+) Teeth Intact, Dental Advisory Given   Pulmonary neg pulmonary ROS,    Pulmonary exam normal breath sounds clear to auscultation       Cardiovascular Exercise Tolerance: Good hypertension, negative cardio ROS   Rhythm:regular Rate:Normal     Neuro/Psych negative neurological ROS  negative psych ROS   GI/Hepatic negative GI ROS, Neg liver ROS,   Endo/Other  negative endocrine ROS  Renal/GU negative Renal ROS  negative genitourinary   Musculoskeletal   Abdominal   Peds  Hematology negative hematology ROS (+)   Anesthesia Other Findings   Reproductive/Obstetrics negative OB ROS                            Anesthesia Physical Anesthesia Plan  ASA: II  Anesthesia Plan: General   Post-op Pain Management:    Induction:   PONV Risk Score and Plan: Propofol infusion  Airway Management Planned: Nasal Cannula  Additional Equipment:   Intra-op Plan:   Post-operative Plan:   Informed Consent: I have reviewed the patients History and Physical, chart, labs and discussed the procedure including the risks, benefits and alternatives for the proposed anesthesia with the patient or authorized representative who has indicated his/her understanding and acceptance.     Dental advisory given  Plan Discussed with: CRNA  Anesthesia Plan Comments:        Anesthesia Quick Evaluation

## 2019-11-11 NOTE — Anesthesia Procedure Notes (Signed)
Date/Time: 11/11/2019 8:53 AM Performed by: Vista Deck, CRNA Pre-anesthesia Checklist: Patient identified, Emergency Drugs available, Suction available, Timeout performed and Patient being monitored Patient Re-evaluated:Patient Re-evaluated prior to induction Oxygen Delivery Method: Nasal Cannula

## 2019-11-11 NOTE — Op Note (Signed)
[facility] Patient Name: George Horton Procedure Date: 11/11/2019 8:38 AM MRN: [id number] Date of Birth: 1950/10/14 Attending MD: Elon Alas. Abbey Chatters DO CSN: [id number] Age: 69 Admit Type: Outpatient Procedure:                Colonoscopy Indications:              Screening for colorectal malignant neoplasm Providers:                Elon Alas. Andreyah Natividad, DO, Otis Peak B. Sharon Seller, RN,                            Caprice Kluver, Nelma Rothman, Technician Referring MD:              Medicines:                See the Anesthesia note for documentation of the                            administered medications Complications:            No immediate complications. Estimated Blood Loss:     Estimated blood loss was minimal. Procedure:                Pre-Anesthesia Assessment:                           - The anesthesia plan was to use monitored                            anesthesia care (MAC).                           After obtaining informed consent, the colonoscope                            was passed under direct vision. Throughout the                            procedure, the patient's blood pressure, pulse, and                            oxygen saturations were monitored continuously. The                            [id number]-[id number] ([id number]) scope was introduced through                            the anus and advanced to the the cecum, identified                            by appendiceal orifice and ileocecal valve. The                            colonoscopy was performed without difficulty. The                            patient tolerated  the procedure well. The quality                            of the bowel preparation was evaluated using the                            BBPS [facility] Bowel Preparation Scale) with scores                            of: Right Colon = 3, Transverse Colon = 3 and Left                            Colon = 3 (entire mucosa seen well with no residual                             staining, small fragments of stool or opaque                            liquid). The total BBPS score equals 9. Scope In: 8:58:06 AM Scope Out: 9:09:01 AM Scope Withdrawal Time: 0 hours 8 minutes 33 seconds  Total Procedure Duration: 0 hours 10 minutes 55 seconds  Findings:      The perianal and digital rectal examinations were normal.      Non-bleeding internal hemorrhoids were found during retroflexion.      Multiple small-mouthed diverticula were found in the sigmoid colon.      A 2 mm polyp was found in the transverse colon. The polyp was sessile.       The polyp was removed with a jumbo cold forceps. Resection and retrieval       were complete.      A 2 mm polyp was found in the descending colon. The polyp was sessile.       The polyp was removed with a jumbo cold forceps. Resection and retrieval       were complete. Impression:               - Non-bleeding internal hemorrhoids.                           - Diverticulosis in the sigmoid colon.                           - One 2 mm polyp in the transverse colon, removed                            with a jumbo cold forceps. Resected and retrieved.                           - One 2 mm polyp in the descending colon, removed                            with a jumbo cold forceps. Resected and retrieved. Moderate Sedation:      Per Anesthesia Care Recommendation:           - Patient has a contact number available for  emergencies. The signs and symptoms of potential                            delayed complications were discussed with the                            patient. Return to normal activities tomorrow.                            Written discharge instructions were provided to the                            patient.                           - Resume previous diet.                           - Continue present medications.                           - Await pathology results.                           -  Repeat colonoscopy in 7-10 years for surveillance                            based on pathology results.                           - Return to GI clinic PRN. Procedure Code(s):        --- Professional ---                           [phone], Colonoscopy, flexible; with biopsy, single                            or multiple Diagnosis Code(s):        --- Professional ---                           Z12.11, Encounter for screening for malignant                            neoplasm of colon                           K64.8, Other hemorrhoids                           K63.5, Polyp of colon                           K57.30, Diverticulosis of large intestine without                            perforation or abscess without bleeding CPT copyright 2019 American Medical Association. All  rights reserved. The codes documented in this report are preliminary and upon coder review may  be revised to meet current compliance requirements. Elon Alas. Abbey Chatters, [place] Abbey Chatters, DO 11/11/2019 9:13:12 AM This report has been signed electronically. Number of Addenda: 0

## 2019-11-11 NOTE — Anesthesia Postprocedure Evaluation (Signed)
Anesthesia Post Note  Patient: Shaun Runyon  Procedure(s) Performed: COLONOSCOPY WITH PROPOFOL (N/A ) POLYPECTOMY  Patient location during evaluation: Endoscopy Anesthesia Type: General Level of consciousness: awake and alert and patient cooperative Pain management: satisfactory to patient Vital Signs Assessment: post-procedure vital signs reviewed and stable Respiratory status: spontaneous breathing Cardiovascular status: stable Postop Assessment: no apparent nausea or vomiting Anesthetic complications: no   No complications documented.   Last Vitals:  Vitals:   11/11/19 0808 11/11/19 0914  BP: (!) 162/88 115/74  Pulse: 81 77  Resp: 18 (!) 21  Temp: 36.9 C 36.8 C  SpO2: 96% 95%    Last Pain:  Vitals:   11/11/19 0914  TempSrc: Oral  PainSc: 0-No pain                 Kathlynn Swofford

## 2019-11-11 NOTE — Discharge Instructions (Addendum)
  Colonoscopy Discharge Instructions  Read the instructions outlined below and refer to this sheet in the next few weeks. These discharge instructions provide you with general information on caring for yourself after you leave the hospital. Your doctor may also give you specific instructions. While your treatment has been planned according to the most current medical practices available, unavoidable complications occasionally occur.   ACTIVITY  You may resume your regular activity, but move at a slower pace for the next 24 hours.   Take frequent rest periods for the next 24 hours.   Walking will help get rid of the air and reduce the bloated feeling in your belly (abdomen).   No driving for 24 hours (because of the medicine (anesthesia) used during the test).    Do not sign any important legal documents or operate any machinery for 24 hours (because of the anesthesia used during the test).  NUTRITION  Drink plenty of fluids.   You may resume your normal diet as instructed by your doctor.   Begin with a light meal and progress to your normal diet. Heavy or fried foods are harder to digest and may make you feel sick to your stomach (nauseated).   Avoid alcoholic beverages for 24 hours or as instructed.  MEDICATIONS  You may resume your normal medications unless your doctor tells you otherwise.  WHAT YOU CAN EXPECT TODAY  Some feelings of bloating in the abdomen.   Passage of more gas than usual.   Spotting of blood in your stool or on the toilet paper.  IF YOU HAD POLYPS REMOVED DURING THE COLONOSCOPY:  No aspirin products for 7 days or as instructed.   No alcohol for 7 days or as instructed.   Eat a soft diet for the next 24 hours.  FINDING OUT THE RESULTS OF YOUR TEST Not all test results are available during your visit. If your test results are not back during the visit, make an appointment with your caregiver to find out the results. Do not assume everything is normal if  you have not heard from your caregiver or the medical facility. It is important for you to follow up on all of your test results.  SEEK IMMEDIATE MEDICAL ATTENTION IF:  You have more than a spotting of blood in your stool.   Your belly is swollen (abdominal distention).   You are nauseated or vomiting.   You have a temperature over 101.   You have abdominal pain or discomfort that is severe or gets worse throughout the day.   Your colonoscopy showed 2 small polyps which were removed successfully.  Await pathology results (my office will contact you).  Repeat colonoscopy in 7 to 10 years depending on pathology.  Follow-up with GI as needed.  I hope you have a great rest of your week!

## 2019-11-11 NOTE — Transfer of Care (Signed)
Immediate Anesthesia Transfer of Care Note  Patient: George Horton  Procedure(s) Performed: COLONOSCOPY WITH PROPOFOL (N/A ) POLYPECTOMY  Patient Location: Endoscopy Unit  Anesthesia Type:General  Level of Consciousness: awake and patient cooperative  Airway & Oxygen Therapy: Patient Spontanous Breathing  Post-op Assessment: Report given to RN and Post -op Vital signs reviewed and stable  Post vital signs: Reviewed and stable  Last Vitals:  Vitals Value Taken Time  BP    Temp 36.8 C 11/11/19 0914  Pulse 77 11/11/19 0914  Resp 21 11/11/19 0914  SpO2 95 % 11/11/19 0914    Last Pain:  Vitals:   11/11/19 0914  TempSrc: Oral  PainSc: 0-No pain      Patients Stated Pain Goal: 4 (22/57/50 5183)  Complications: No complications documented.

## 2019-11-11 NOTE — H&P (Signed)
Primary Care Physician:  Beola Cord, FNP Primary Gastroenterologist:  Dr. Abbey Chatters  Pre-Procedure History & Physical: HPI:  George Horton is a 69 y.o. male is here for a screening colonoscopy.   Past Medical History:  Diagnosis Date  . Hypertension     No past surgical history on file.  Prior to Admission medications   Medication Sig Start Date End Date Taking? Authorizing Provider  aspirin EC 81 MG tablet Take 81 mg by mouth daily. Swallow whole.   Yes [provider]  hydrochlorothiazide (HYDRODIURIL) 12.5 MG tablet Take 12.5 mg by mouth daily.  05/28/19  Yes [provider]  lisinopril (ZESTRIL) 20 MG tablet Take 20 mg by mouth daily. 06/27/19  Yes [provider]  naproxen sodium (ALEVE) 220 MG tablet Take 440 mg by mouth daily as needed (pain).    Yes [provider]  omeprazole (PRILOSEC) 40 MG capsule Take 40 mg by mouth daily.  04/07/19  Yes [provider]  psyllium (METAMUCIL) 58.6 % powder Take 1 packet by mouth every evening.    Yes [provider]    Allergies as of 10/07/2019 - Review Complete 10/07/2019  Allergen Reaction Noted  . Gastrografin [diatrizoate meglumine & sodium] Other (See Comments) 07/02/2019    Family History  Problem Relation Age of Onset  . Atrial fibrillation Mother   . Heart failure Father   . Colon cancer Neg Hx     Social History   Socioeconomic History  . Marital status: Married    Spouse name: Not on file  . Number of children: Not on file  . Years of education: Not on file  . Highest education level: Not on file  Occupational History  . Not on file  Tobacco Use  . Smoking status: Never Smoker  . Smokeless tobacco: Never Used  Substance and Sexual Activity  . Alcohol use: Never  . Drug use: Never  . Sexual activity: Not on file  Other Topics Concern  . Not on file  Social History Narrative  . Not on file   Social Determinants of Health   Financial Resource Strain:    . Difficulty of Paying Living Expenses: Not on file  Food Insecurity:   . Worried About Charity fundraiser in the Last Year: Not on file  . Ran Out of Food in the Last Year: Not on file  Transportation Needs:   . Lack of Transportation (Medical): Not on file  . Lack of Transportation (Non-Medical): Not on file  Physical Activity:   . Days of Exercise per Week: Not on file  . Minutes of Exercise per Session: Not on file  Stress:   . Feeling of Stress : Not on file  Social Connections:   . Frequency of Communication with Friends and Family: Not on file  . Frequency of Social Gatherings with Friends and Family: Not on file  . Attends Religious Services: Not on file  . Active Member of Clubs or Organizations: Not on file  . Attends Archivist Meetings: Not on file  . Marital Status: Not on file  Intimate Partner Violence:   . Fear of Current or Ex-Partner: Not on file  . Emotionally Abused: Not on file  . Physically Abused: Not on file  . Sexually Abused: Not on file    Review of Systems: See HPI, otherwise negative ROS  Impression/Plan: George Horton is here for a colonoscopy to be performed for screening  Risks, benefits, limitations, imponderables and alternatives regarding  colonoscopy have been reviewed with the patient. Questions have been answered. All parties agreeable.

## 2019-11-12 LAB — SURGICAL PATHOLOGY

## 2019-11-13 ENCOUNTER — Encounter (HOSPITAL_COMMUNITY): Payer: Self-pay | Admitting: Internal Medicine

## 2020-08-05 IMAGING — CT CT ANGIO CHEST
2 of 6 series · 18 of 46 positions shown · IV contrast (Omnipaque or Isovue)
Comparison: None.

CLINICAL DATA: Shortness of breath, elevated D-dimer, chest pain,
evaluate for PE

EXAM:
CT ANGIOGRAPHY CHEST WITH CONTRAST
TECHNIQUE: Multidetector CT imaging of the chest was performed using the
standard protocol during bolus administration of intravenous
contrast. Multiplanar CT image reconstructions and MIPs were
obtained to evaluate the vascular anatomy.
CONTRAST:  100mL OMNIPAQUE IOHEXOL 350 MG/ML SOLN

[Series 6: pe axial thins · axial · 0.92mm/px · z∈[+1311,+1588]mm · 15 of 305 slices shown]
[im 14/305  lung]
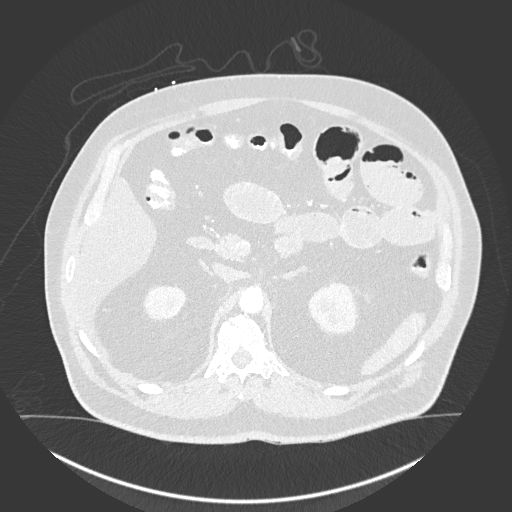
[im 40/305  soft-tissue]
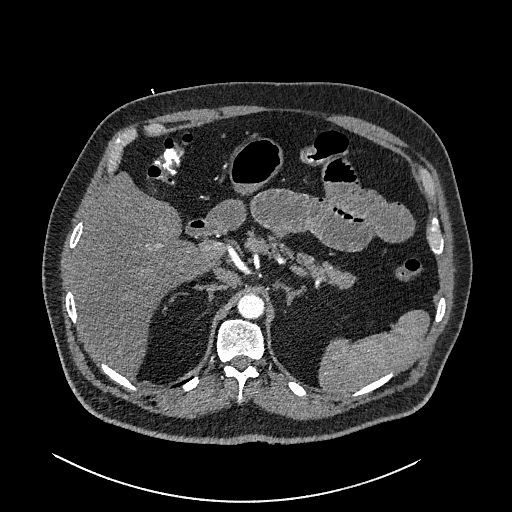
[im 53/305  lung]
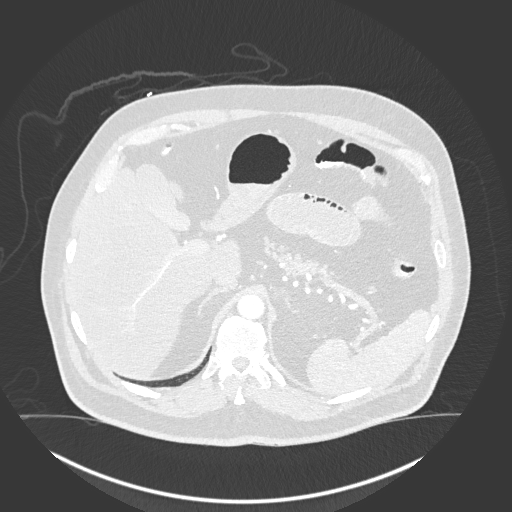
[im 80/305  soft-tissue]
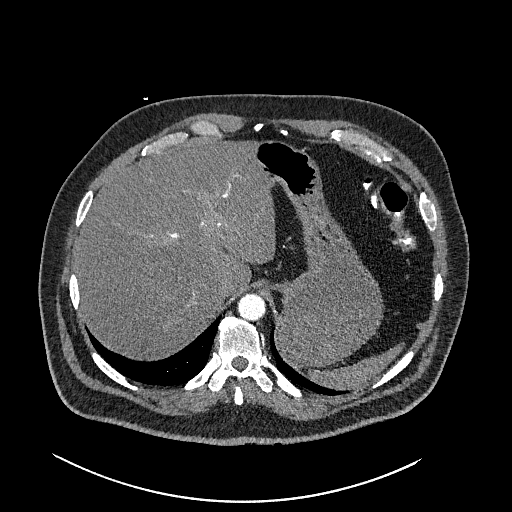
[im 93/305  lung]
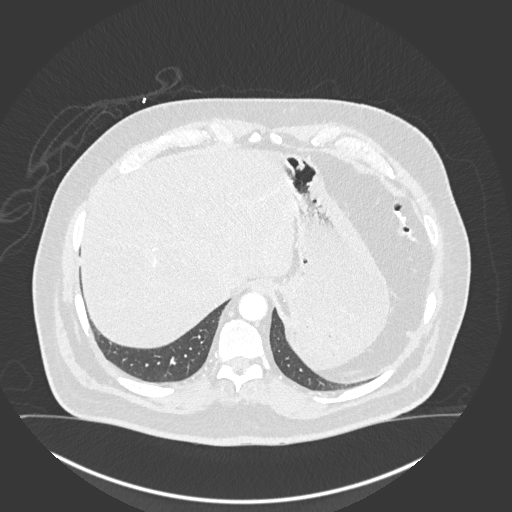
[im 119/305  soft-tissue]
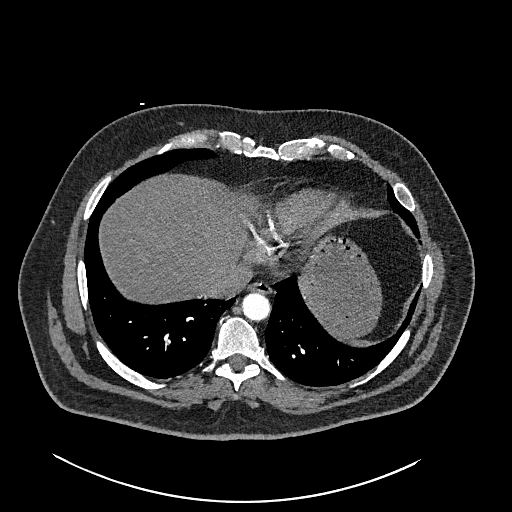
[im 133/305  lung]
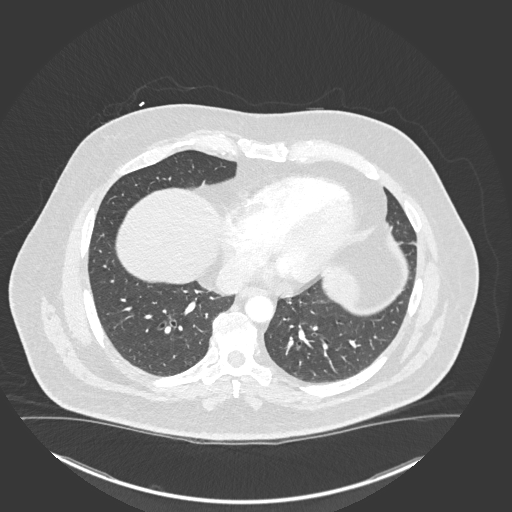
[im 159/305  soft-tissue]
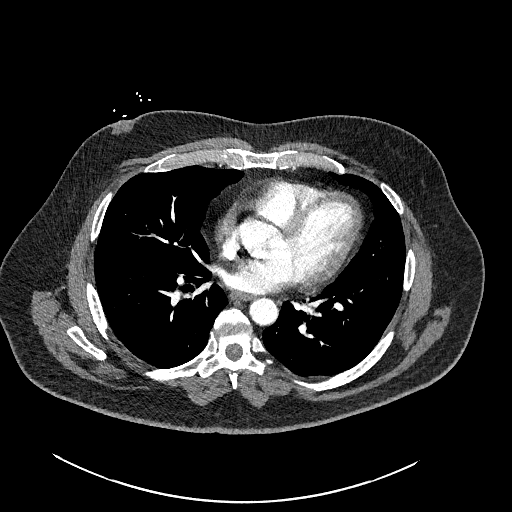
[im 172/305  lung]
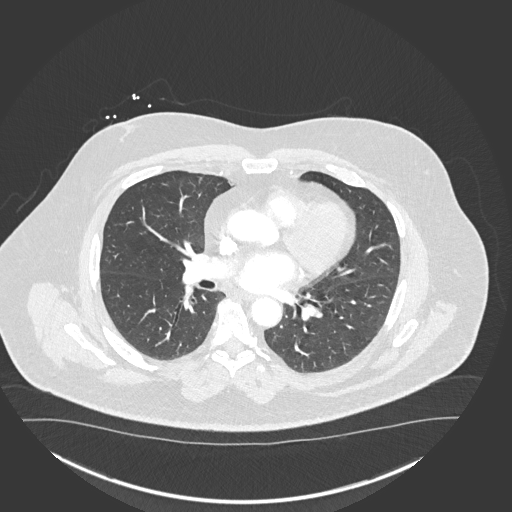
[im 186/305  soft-tissue]
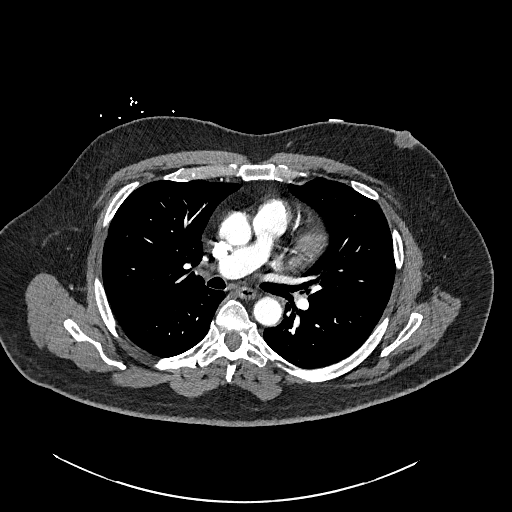
[im 212/305  lung]
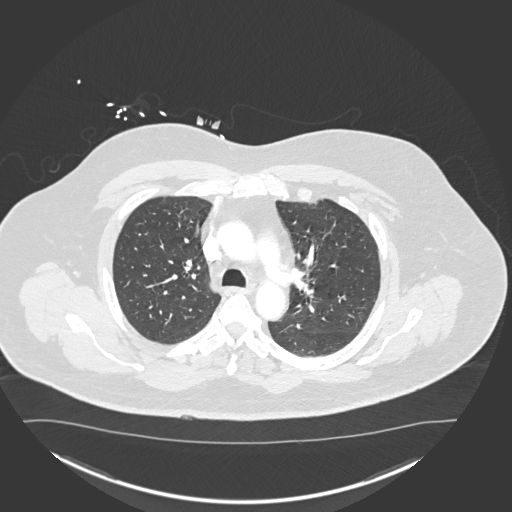
[im 225/305  soft-tissue]
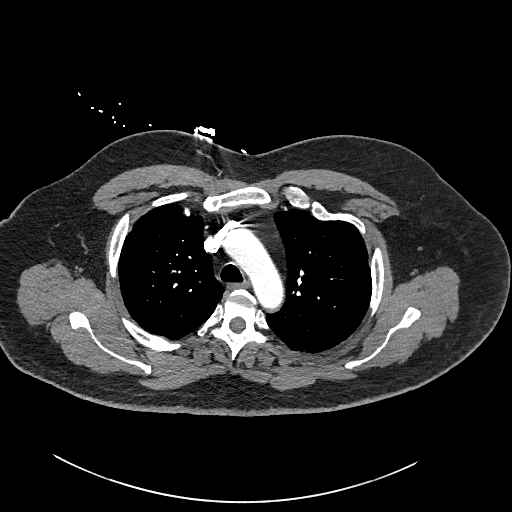
[im 252/305  lung]
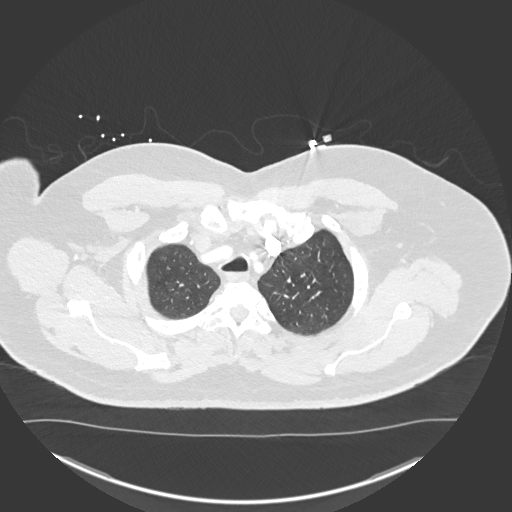
[im 265/305  soft-tissue]
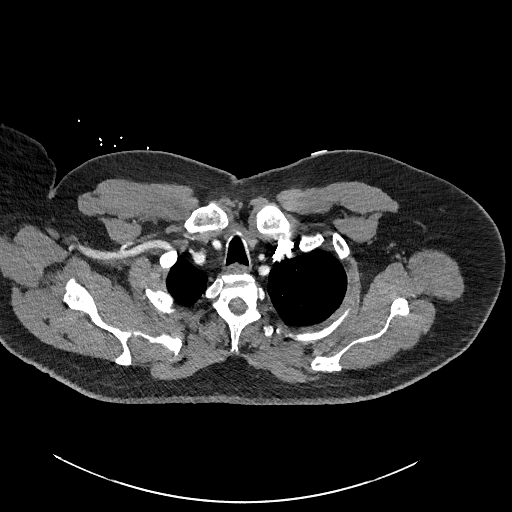
[im 291/305  lung]
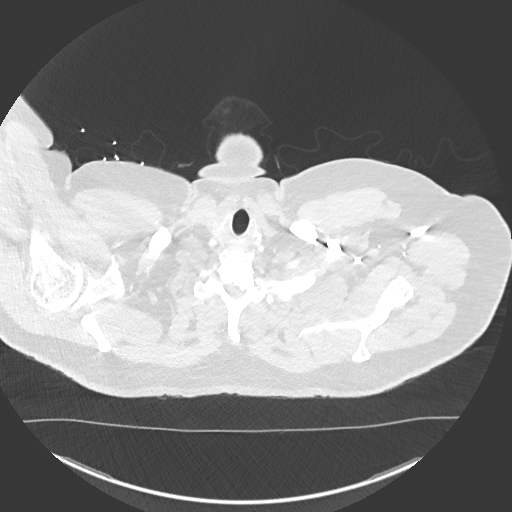

[Series 8: cor soft · coronal · 0.65mm/px · 3 of 176 slices shown]
[im 44/176  soft-tissue]
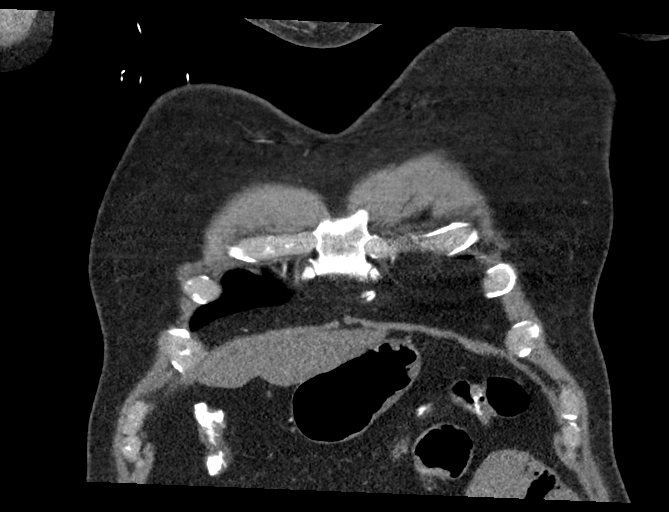
[im 88/176  soft-tissue]
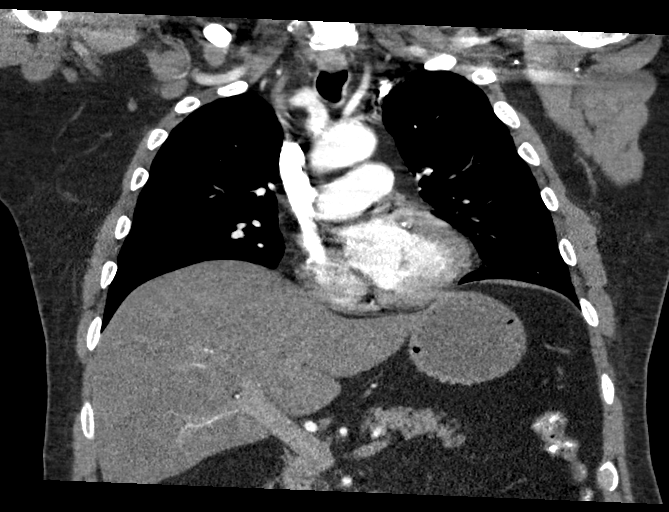
[im 132/176  soft-tissue]
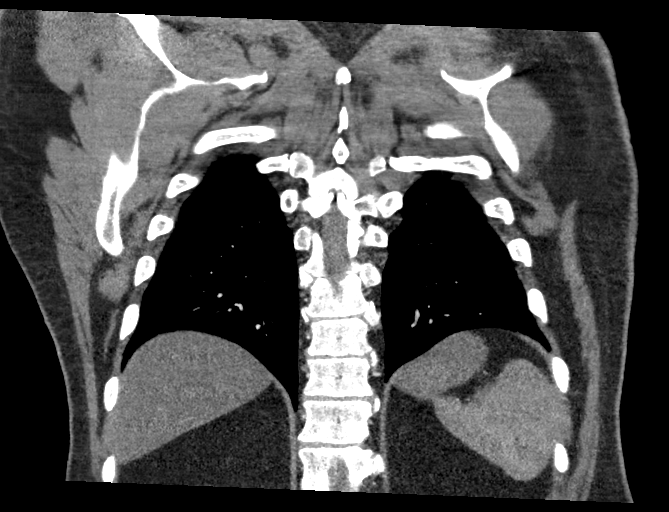

[18 of 46 positions shown; findings below may reference images not displayed]

FINDINGS: Cardiovascular: Satisfactory opacification of the pulmonary arteries
to the segmental level. No evidence of pulmonary embolism. Normal
heart size. Three-vessel coronary artery calcifications. No
pericardial effusion.

Mediastinum/Nodes: No enlarged mediastinal, hilar, or axillary lymph
nodes. Thyroid gland, trachea, and esophagus demonstrate no
significant findings.

Lungs/Pleura: Lungs are clear. No pleural effusion or pneumothorax.

Upper Abdomen: No acute abnormality.  Hepatic steatosis.

Musculoskeletal: No chest wall abnormality. No acute or significant
osseous findings.

Review of the MIP images confirms the above findings.
IMPRESSION: 1.  Negative examination for pulmonary embolism.

2.  Coronary artery disease.

3.  Hepatic steatosis.

## 2022-11-25 ENCOUNTER — Other Ambulatory Visit: Payer: Self-pay | Admitting: Family Medicine

## 2022-11-25 ENCOUNTER — Ambulatory Visit: Payer: Self-pay

## 2022-11-25 DIAGNOSIS — R053 Chronic cough: Secondary | ICD-10-CM
